# Patient Record
Sex: Female | Born: 1990 | Race: Black or African American | Hispanic: No | Marital: Single | Smoking: Never smoker
Health system: Southern US, Community
[De-identification: ages and names within clinical notes are randomized; demographics above are authoritative.]

## PROBLEM LIST (undated history)

## (undated) DIAGNOSIS — Z789 Other specified health status: Secondary | ICD-10-CM

## (undated) HISTORY — PX: NO PAST SURGERIES: SHX2092

---
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
## 2012-12-02 ENCOUNTER — Emergency Department (INDEPENDENT_AMBULATORY_CARE_PROVIDER_SITE_OTHER)
Admission: EM | Admit: 2012-12-02 | Discharge: 2012-12-02 | Disposition: A | Discharge status: Home / Self Care | Payer: Self-pay | Source: Home / Self Care | Attending: Emergency Medicine | Admitting: Emergency Medicine

## 2012-12-02 ENCOUNTER — Encounter (HOSPITAL_COMMUNITY): Payer: Self-pay | Admitting: Emergency Medicine

## 2012-12-02 DIAGNOSIS — S139XXA Sprain of joints and ligaments of unspecified parts of neck, initial encounter: Secondary | ICD-10-CM

## 2012-12-02 DIAGNOSIS — S161XXA Strain of muscle, fascia and tendon at neck level, initial encounter: Secondary | ICD-10-CM

## 2012-12-02 DIAGNOSIS — R51 Headache: Secondary | ICD-10-CM

## 2012-12-02 MED ORDER — TRAMADOL HCL 50 MG PO TABS: 100.0000 mg | ORAL_TABLET | Freq: Three times a day (TID) | ORAL | Status: DC | PRN

## 2012-12-02 MED ORDER — MELOXICAM 15 MG PO TABS: 15.0000 mg | ORAL_TABLET | Freq: Every day | ORAL | Status: DC

## 2012-12-02 MED ORDER — METHOCARBAMOL 500 MG PO TABS: 500.0000 mg | ORAL_TABLET | Freq: Three times a day (TID) | ORAL | Status: DC
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
## 2016-12-08 NOTE — L&D Delivery Note (Signed)
Patient is 26 y.o. G1P1001 [redacted]w[redacted]d admitted active labor and SROM. S/p augmentation with Pitocin.  Prenatal course also complicated by chlamydia and trich-treated.  Delivery Note At 11:58 AM a viable female was delivered via Vaginal, Spontaneous Delivery (Presentation: ;  ).  APGAR: 4, 5; weight 8 lb 4.3 oz (3750 g).   Placenta status: intact, spontaneous, retained for 45 mins  Cord: 3 vessel  Anesthesia:  epidural Episiotomy: None Lacerations: Labial Suture Repair: n/a Est. Blood Loss (mL): 450  Mom to postpartum.  Baby to NICU.    Upon arrival, patient was complete. She pushed with good maternal effort to deliver a viable female infant in cephalic, LOA position over intact perineum. No nuchal cord present.Anterior shoulder delivered easily. Baby was noted to have poor tone, no respirations, and poor color and place on maternal abdomen for oral suctioning, drying and stimulation. Cord was promptly clamped baby was stimulated, dried, suctioned, and moved to baby warmer. Resuscitation efforts continued and on call pediatrician arrived. Baby was intubated and taken to NICU. Placenta remained retained for 45 minutes. Philipp Deputy, CNM came to room and assisted. Patient given buccal cytotec and placenta delivered spontaneously with gentle cord traction. Fundus firm with massage and Pitocin. Perineum inspected and found to have labial laceration, which was found to be hemostatic.No repair necessary. Counts of sharps, instruments, and lap pads were all correct.   Rolm Bookbinder, DO [place] [facility]

## 2017-03-05 ENCOUNTER — Ambulatory Visit (INDEPENDENT_AMBULATORY_CARE_PROVIDER_SITE_OTHER): Payer: Medicaid Other | Admitting: Obstetrics

## 2017-03-05 ENCOUNTER — Encounter: Payer: Self-pay | Admitting: Obstetrics

## 2017-03-05 VITALS — BP 129/86 | HR 96 | Ht 62.0 in | Wt 144.0 lb

## 2017-03-05 DIAGNOSIS — Z113 Encounter for screening for infections with a predominantly sexual mode of transmission: Secondary | ICD-10-CM

## 2017-03-05 DIAGNOSIS — Z3400 Encounter for supervision of normal first pregnancy, unspecified trimester: Secondary | ICD-10-CM

## 2017-03-05 DIAGNOSIS — Z3480 Encounter for supervision of other normal pregnancy, unspecified trimester: Secondary | ICD-10-CM

## 2017-03-05 DIAGNOSIS — Z3481 Encounter for supervision of other normal pregnancy, first trimester: Secondary | ICD-10-CM | POA: Diagnosis not present

## 2017-03-05 DIAGNOSIS — J301 Allergic rhinitis due to pollen: Secondary | ICD-10-CM

## 2017-03-05 MED ORDER — LORATADINE 10 MG PO TABS: 10.0000 mg | ORAL_TABLET | Freq: Every day | ORAL | 11 refills | Status: DC

## 2017-03-05 MED ORDER — PRENATE PIXIE 10-0.6-0.4-200 MG PO CAPS: 1.0000 | ORAL_CAPSULE | Freq: Every day | ORAL | 11 refills | Status: DC

## 2017-03-05 NOTE — Addendum Note (Signed)
Addended by: Dalphine HandingGARDNER, Robina Hamor L on: 03/05/2017 03:56 PM   Modules accepted: Orders

## 2017-03-05 NOTE — Progress Notes (Signed)
Pt presents for NOB. Pt wants genetic screening.

## 2017-03-05 NOTE — Progress Notes (Signed)
Subjective:    Heidi Rocha is being seen today for her first obstetrical visit.  This is not a planned pregnancy. She is at 7466w0d gestation. Her obstetrical history is significant for none. Relationship with FOB: significant other, not living together. Patient does intend to breast feed. Pregnancy history fully reviewed.  The information documented in the HPI was reviewed and verified.  Menstrual History: OB History    Gravida Para Term Preterm AB Living   1             SAB TAB Ectopic Multiple Live Births                   Patient's last menstrual period was 11/27/2016.    History reviewed. No pertinent past medical history.  History reviewed. No pertinent surgical history.   (Not in a hospital admission) Allergies  Allergen Reactions  . Tuberculin Tests     Social History  Substance Use Topics  . Smoking status: Never Smoker  . Smokeless tobacco: Never Used  . Alcohol use No    History reviewed. No pertinent family history.   Review of Systems Constitutional: negative for weight loss Gastrointestinal: negative for vomiting Genitourinary:negative for genital lesions and vaginal discharge and dysuria Musculoskeletal:negative for back pain Behavioral/Psych: negative for abusive relationship, depression, illegal drug usage and tobacco use    Objective:    BP 129/86   Pulse 96   Ht 5\' 2"  (1.575 m)   Wt 144 lb (65.3 kg)   LMP 11/27/2016   BMI 26.34 kg/m  General Appearance:    Alert, cooperative, no distress, appears stated age  Head:    Normocephalic, without obvious abnormality, atraumatic  Eyes:    PERRL, conjunctiva/corneas clear, EOM's intact, fundi    benign, both eyes  Ears:    Normal TM's and external ear canals, both ears  Nose:   Nares normal, septum midline, mucosa normal, no drainage    or sinus tenderness  Throat:   Lips, mucosa, and tongue normal; teeth and gums normal  Neck:   Supple, symmetrical, trachea midline, no adenopathy;    thyroid:   no enlargement/tenderness/nodules; no carotid   bruit or JVD  Back:     Symmetric, no curvature, ROM normal, no CVA tenderness  Lungs:     Clear to auscultation bilaterally, respirations unlabored  Chest Wall:    No tenderness or deformity   Heart:    Regular rate and rhythm, S1 and S2 normal, no murmur, rub   or gallop  Breast Exam:    No tenderness, masses, or nipple abnormality  Abdomen:     Soft, non-tender, bowel sounds active all four quadrants,    no masses, no organomegaly  Genitalia:    Normal female without lesion, discharge or tenderness  Extremities:   Extremities normal, atraumatic, no cyanosis or edema  Pulses:   2+ and symmetric all extremities  Skin:   Skin color, texture, turgor normal, no rashes or lesions  Lymph nodes:   Cervical, supraclavicular, and axillary nodes normal  Neurologic:   CNII-XII intact, normal strength, sensation and reflexes    throughout      Lab Review Urine pregnancy test Labs reviewed yes Radiologic studies reviewed no Assessment:    Pregnancy at 3066w0d weeks    Plan:     1. Encounter for prenatal care of first pregnancy, antepartum Rx: - Quad Screen next visit - PNV's Rx  Prenatal vitamins.  Counseling provided regarding continued use of seat belts, cessation  of alcohol consumption, smoking or use of illicit drugs; infection precautions i.e., influenza/TDAP immunizations, toxoplasmosis,CMV, parvovirus, listeria and varicella; workplace safety, exercise during pregnancy; routine dental care, safe medications, sexual activity, hot tubs, saunas, pools, travel, caffeine use, fish and methlymercury, potential toxins, hair treatments, varicose veins Weight gain recommendations per IOM guidelines reviewed: underweight/BMI< 18.5--> gain 28 - 40 lbs; normal weight/BMI 18.5 - 24.9--> gain 25 - 35 lbs; overweight/BMI 25 - 29.9--> gain 15 - 25 lbs; obese/BMI >30->gain  11 - 20 lbs Problem list reviewed and updated. FIRST/CF mutation testing/NIPT/QUAD  SCREEN/fragile X/Ashkenazi Jewish population testing/Spinal muscular atrophy discussed: requested. Role of ultrasound in pregnancy discussed; fetal survey: requested. Amniocentesis discussed: not indicated. VBAC calculator score: VBAC consent form provided Meds ordered this encounter  Medications  . Prenat-FeAsp-Meth-FA-DHA w/o A (PRENATE PIXIE) 10-0.6-0.4-200 MG CAPS    Sig: Take 1 capsule by mouth daily before breakfast.    Dispense:  30 capsule    Refill:  11  . loratadine (CLARITIN) 10 MG tablet    Sig: Take 1 tablet (10 mg total) by mouth daily.    Dispense:  30 tablet    Refill:  11   Orders Placed This Encounter  Procedures  . Culture, OB Urine  . Obstetric Panel, Including HIV  . VITAMIN D 25 Hydroxy (Vit-D Deficiency, Fractures)  . Varicella zoster antibody, IgG    Follow up in 2 weeks. 50% of 20 min visit spent on counseling and coordination of care.                                                                                                   Patient ID: STELLAROSE CERNY, female   DOB: [birth year], 26 y.o.   MRN: [id number]

## 2017-03-05 NOTE — Addendum Note (Signed)
Addended by: Dalphine HandingGARDNER, Noreta Kue L on: 03/05/2017 04:48 PM   Modules accepted: Orders

## 2017-03-07 LAB — URINE CULTURE, OB REFLEX

## 2017-03-07 LAB — CULTURE, OB URINE

## 2017-03-09 ENCOUNTER — Other Ambulatory Visit: Payer: Self-pay | Admitting: *Deleted

## 2017-03-09 DIAGNOSIS — Z3480 Encounter for supervision of other normal pregnancy, unspecified trimester: Secondary | ICD-10-CM

## 2017-03-09 LAB — OBSTETRIC PANEL, INCLUDING HIV
Antibody Screen: NEGATIVE
Basophils Absolute: 0 10*3/uL (ref 0.0–0.2)
Basos: 0 %
EOS (ABSOLUTE): 0.2 10*3/uL (ref 0.0–0.4)
Eos: 1 %
HIV Screen 4th Generation wRfx: NONREACTIVE
Hematocrit: 34.5 % (ref 34.0–46.6)
Hemoglobin: 11.4 g/dL (ref 11.1–15.9)
Hepatitis B Surface Ag: NEGATIVE
Immature Grans (Abs): 0.2 10*3/uL — ABNORMAL HIGH (ref 0.0–0.1)
Immature Granulocytes: 1 %
Lymphocytes Absolute: 2.5 10*3/uL (ref 0.7–3.1)
Lymphs: 20 %
MCH: 28.5 pg (ref 26.6–33.0)
MCHC: 33 g/dL (ref 31.5–35.7)
MCV: 86 fL (ref 79–97)
Monocytes Absolute: 0.9 10*3/uL (ref 0.1–0.9)
Monocytes: 7 %
Neutrophils Absolute: 9.1 10*3/uL — ABNORMAL HIGH (ref 1.4–7.0)
Neutrophils: 71 %
Platelets: 313 10*3/uL (ref 150–379)
RBC: 4 x10E6/uL (ref 3.77–5.28)
RDW: 15 % (ref 12.3–15.4)
RPR Ser Ql: NONREACTIVE
Rh Factor: POSITIVE
Rubella Antibodies, IGG: <0.9 index — ABNORMAL LOW (ref >0.99)
WBC: 12.6 10*3/uL — ABNORMAL HIGH (ref 3.4–10.8)

## 2017-03-09 LAB — HEMOGLOBINOPATHY EVALUATION
HGB C: 0 %
HGB S: 0 %
HGB VARIANT: 0 %
Hemoglobin A2 Quantitation: 2.6 % (ref 1.8–3.2)
Hemoglobin F Quantitation: 0 % (ref 0.0–2.0)
Hgb A: 97.4 % (ref 96.4–98.8)

## 2017-03-09 LAB — VITAMIN D 25 HYDROXY (VIT D DEFICIENCY, FRACTURES): Vit D, 25-Hydroxy: 13.2 ng/mL — ABNORMAL LOW (ref 30.0–100.0)

## 2017-03-09 LAB — VARICELLA ZOSTER ANTIBODY, IGG: Varicella zoster IgG: 625 index (ref >165)

## 2017-03-09 MED ORDER — PRENATE PIXIE 10-0.6-0.4-200 MG PO CAPS: 1.0000 | ORAL_CAPSULE | Freq: Every day | ORAL | 11 refills | Status: DC

## 2017-03-09 MED ORDER — VITAFOL GUMMIES 3.33-0.333-34.8 MG PO CHEW: 3.0000 | CHEWABLE_TABLET | Freq: Every day | ORAL | 11 refills | Status: DC

## 2017-03-09 NOTE — Progress Notes (Signed)
Pt called to office stating she could not take PNV that was prescribed. Pt request PNV gummy. Vitafol gummy sent to pharmacy.

## 2017-03-10 ENCOUNTER — Other Ambulatory Visit: Payer: Self-pay | Admitting: Obstetrics

## 2017-03-10 LAB — CERVICOVAGINAL ANCILLARY ONLY
BACTERIAL VAGINITIS: NEGATIVE
Candida vaginitis: POSITIVE — AB
NEISSERIA GONORRHEA: NEGATIVE
TRICH (WINDOWPATH): NEGATIVE

## 2017-03-10 LAB — CYTOLOGY - PAP: Diagnosis: NEGATIVE

## 2017-03-11 ENCOUNTER — Other Ambulatory Visit: Payer: Self-pay | Admitting: Obstetrics

## 2017-03-11 DIAGNOSIS — B373 Candidiasis of vulva and vagina: Secondary | ICD-10-CM

## 2017-03-11 DIAGNOSIS — B3731 Acute candidiasis of vulva and vagina: Secondary | ICD-10-CM

## 2017-03-11 MED ORDER — TERCONAZOLE 0.8 % VA CREA: 1.0000 | TOPICAL_CREAM | Freq: Every day | VAGINAL | 0 refills | Status: DC

## 2017-03-19 ENCOUNTER — Other Ambulatory Visit: Payer: Medicaid Other

## 2017-03-19 ENCOUNTER — Other Ambulatory Visit: Payer: Self-pay

## 2017-03-19 DIAGNOSIS — Z3400 Encounter for supervision of normal first pregnancy, unspecified trimester: Secondary | ICD-10-CM

## 2017-03-24 LAB — AFP, QUAD SCREEN
DIA MOM VALUE: 1.01
DIA VALUE (EIA): 178.1 pg/mL
DSR (By Age)    1 IN: 1071
DSR (Second Trimester) 1 IN: 7105
Gestational Age: 16 WEEKS
MATERNAL AGE AT EDD: 24 a
MSAFP Mom: 1.06
MSAFP: 39.2 ng/mL
MSHCG MOM: 1.08
MSHCG: 43421 m[IU]/mL
OSB RISK: 10000
T18 (By Age): 1:4172 {titer}
TEST RESULTS AFP: NEGATIVE
UE3 VALUE: 0.8 ng/mL
Weight: 144 [lb_av]
uE3 Mom: 1.03

## 2017-04-02 ENCOUNTER — Ambulatory Visit (INDEPENDENT_AMBULATORY_CARE_PROVIDER_SITE_OTHER): Payer: Medicaid Other | Admitting: Obstetrics

## 2017-04-02 ENCOUNTER — Encounter: Payer: Self-pay | Admitting: Obstetrics

## 2017-04-02 VITALS — BP 125/75 | HR 109 | Wt 156.0 lb

## 2017-04-02 DIAGNOSIS — Z3689 Encounter for other specified antenatal screening: Secondary | ICD-10-CM | POA: Diagnosis not present

## 2017-04-02 DIAGNOSIS — Z3402 Encounter for supervision of normal first pregnancy, second trimester: Secondary | ICD-10-CM

## 2017-04-02 DIAGNOSIS — Z3480 Encounter for supervision of other normal pregnancy, unspecified trimester: Secondary | ICD-10-CM

## 2017-04-02 NOTE — Progress Notes (Signed)
Subjective:  Heidi Rocha is a 26 y.o. G1P0 at [redacted]w[redacted]d being seen today for ongoing prenatal care.  She is currently monitored for the following issues for this low-risk pregnancy and has Encounter for prenatal care of first pregnancy, antepartum on her problem list.  Patient reports no complaints.  Contractions: Not present. Vag. Bleeding: None.   . Denies leaking of fluid.   The following portions of the patient's history were reviewed and updated as appropriate: allergies, current medications, past family history, past medical history, past social history, past surgical history and problem list. Problem list updated.  Objective:   Vitals:   04/02/17 1559  BP: 125/75  Pulse: (!) 109  Weight: 156 lb (70.8 kg)    Fetal Status: Fetal Heart Rate (bpm): 150         General:  Alert, oriented and cooperative. Patient is in no acute distress.  Skin: Skin is warm and dry. No rash noted.   Cardiovascular: Normal heart rate noted  Respiratory: Normal respiratory effort, no problems with respiration noted  Abdomen: Soft, gravid, appropriate for gestational age. Pain/Pressure: Absent     Pelvic:  Cervical exam deferred        Extremities: Normal range of motion.     Mental Status: Normal mood and affect. Normal behavior. Normal judgment and thought content.   Urinalysis:      Assessment and Plan:  Pregnancy: G1P0 at [redacted]w[redacted]d  1. Supervision of other normal pregnancy, antepartum Rx: - [place] MFM OB COMP + 14 WK; Future  2. Screening, antenatal, for fetal anatomic survey Rx: - [place] MFM OB COMP + 14 WK; Future  Preterm labor symptoms and general obstetric precautions including but not limited to vaginal bleeding, contractions, leaking of fluid and fetal movement were reviewed in detail with the patient. Please refer to After Visit Summary for other counseling recommendations.  Return in 4 weeks (on 04/30/2017) for ROB.   Brock Bad, MDPatient ID: [name], female   DOB:  [birth year], 26 y.o.   MRN: [id number]

## 2017-04-16 ENCOUNTER — Ambulatory Visit (HOSPITAL_COMMUNITY)
Admission: RE | Admit: 2017-04-16 | Discharge: 2017-04-16 | Disposition: A | Discharge status: Home / Self Care | Payer: Medicaid Other | Source: Ambulatory Visit | Attending: Obstetrics | Admitting: Obstetrics

## 2017-04-16 DIAGNOSIS — Z3A20 20 weeks gestation of pregnancy: Secondary | ICD-10-CM | POA: Diagnosis not present

## 2017-04-16 DIAGNOSIS — Z3482 Encounter for supervision of other normal pregnancy, second trimester: Secondary | ICD-10-CM | POA: Diagnosis not present

## 2017-04-16 DIAGNOSIS — Z3480 Encounter for supervision of other normal pregnancy, unspecified trimester: Secondary | ICD-10-CM

## 2017-04-16 DIAGNOSIS — Z3689 Encounter for other specified antenatal screening: Secondary | ICD-10-CM | POA: Diagnosis present

## 2017-04-30 ENCOUNTER — Ambulatory Visit (INDEPENDENT_AMBULATORY_CARE_PROVIDER_SITE_OTHER): Payer: Medicaid Other | Admitting: Obstetrics

## 2017-04-30 ENCOUNTER — Encounter: Payer: Self-pay | Admitting: Obstetrics

## 2017-04-30 VITALS — BP 128/75 | HR 94 | Wt 166.2 lb

## 2017-04-30 DIAGNOSIS — Z3480 Encounter for supervision of other normal pregnancy, unspecified trimester: Secondary | ICD-10-CM

## 2017-04-30 DIAGNOSIS — Z3482 Encounter for supervision of other normal pregnancy, second trimester: Secondary | ICD-10-CM

## 2017-04-30 DIAGNOSIS — Z3400 Encounter for supervision of normal first pregnancy, unspecified trimester: Secondary | ICD-10-CM

## 2017-04-30 NOTE — Progress Notes (Signed)
Subjective:  Heidi BravoJermey M Rocha is a 26 y.o. G1P0 at 1663w0d being seen today for ongoing prenatal care.  She is currently monitored for the following issues for this low-risk pregnancy and has Encounter for prenatal care of first pregnancy, antepartum on her problem list.  Patient reports no complaints.  Contractions: Not present. Vag. Bleeding: None.  Movement: Present. Denies leaking of fluid.   The following portions of the patient's history were reviewed and updated as appropriate: allergies, current medications, past family history, past medical history, past social history, past surgical history and problem list. Problem list updated.  Objective:   Vitals:   04/30/17 1536  BP: 128/75  Pulse: 94  Weight: 166 lb 3.2 oz (75.4 kg)    Fetal Status: Fetal Heart Rate (bpm): 150   Movement: Present     General:  Alert, oriented and cooperative. Patient is in no acute distress.  Skin: Skin is warm and dry. No rash noted.   Cardiovascular: Normal heart rate noted  Respiratory: Normal respiratory effort, no problems with respiration noted  Abdomen: Soft, gravid, appropriate for gestational age. Pain/Pressure: Absent     Pelvic:  Cervical exam deferred        Extremities: Normal range of motion.  Edema: Trace  Mental Status: Normal mood and affect. Normal behavior. Normal judgment and thought content.   Urinalysis:      Assessment and Plan:  Pregnancy: G1P0 at 5363w0d  1. Encounter for prenatal care of first pregnancy, antepartum   Preterm labor symptoms and general obstetric precautions including but not limited to vaginal bleeding, contractions, leaking of fluid and fetal movement were reviewed in detail with the patient. Please refer to After Visit Summary for other counseling recommendations.  Return in 4 weeks (on 05/28/2017) for ROB.   Brock BadHarper, Charles A, MDPatient ID: Heidi Rocha, female   DOB: 02-05-1991, 26 y.o.   MRN: [id number]

## 2017-04-30 NOTE — Progress Notes (Signed)
Patient reports good fetal movement, denies pain/contractions. 

## 2017-05-28 ENCOUNTER — Encounter: Payer: Self-pay | Admitting: Obstetrics

## 2017-05-28 ENCOUNTER — Ambulatory Visit (INDEPENDENT_AMBULATORY_CARE_PROVIDER_SITE_OTHER): Payer: Medicaid Other | Admitting: Obstetrics

## 2017-05-28 VITALS — BP 110/69 | HR 80 | Wt 171.0 lb

## 2017-05-28 DIAGNOSIS — Z3482 Encounter for supervision of other normal pregnancy, second trimester: Secondary | ICD-10-CM

## 2017-05-28 DIAGNOSIS — Z3480 Encounter for supervision of other normal pregnancy, unspecified trimester: Secondary | ICD-10-CM

## 2017-05-28 NOTE — Progress Notes (Signed)
Subjective:  Heidi Rocha is a [age] y.o. G1P0 at 4834w0d being seen today for ongoing prenatal care.  She is currently monitored for the following issues for this low-risk pregnancy and has Encounter for prenatal care of first pregnancy, antepartum on her problem list.  Patient reports no complaints.  Contractions: Not present. Vag. Bleeding: None.  Movement: Present. Denies leaking of fluid.   The following portions of the patient's history were reviewed and updated as appropriate: allergies, current medications, past family history, past medical history, past social history, past surgical history and problem list. Problem list updated.  Objective:   Vitals:   05/28/17 1509  BP: 110/69  Pulse: 80  Weight: 171 lb (77.6 kg)    Fetal Status: Fetal Heart Rate (bpm): 150   Movement: Present     General:  Alert, oriented and cooperative. Patient is in no acute distress.  Skin: Skin is warm and dry. No rash noted.   Cardiovascular: Normal heart rate noted  Respiratory: Normal respiratory effort, no problems with respiration noted  Abdomen: Soft, gravid, appropriate for gestational age. Pain/Pressure: Absent     Pelvic:  Cervical exam deferred        Extremities: Normal range of motion.  Edema: Trace  Mental Status: Normal mood and affect. Normal behavior. Normal judgment and thought content.   Urinalysis:      Assessment and Plan:  Pregnancy: G1P0 at 6334w0d  1. Supervision of other normal pregnancy, antepartum Doing well.  Preterm labor symptoms and general obstetric precautions including but not limited to vaginal bleeding, contractions, leaking of fluid and fetal movement were reviewed in detail with the patient. Please refer to After Visit Summary for other counseling recommendations.  Return in about 2 weeks (around 06/11/2017) for ROB.   Brock BadHarper, Charles A, MD

## 2017-06-17 ENCOUNTER — Other Ambulatory Visit: Payer: Medicaid Other

## 2017-06-17 ENCOUNTER — Encounter: Payer: Self-pay | Admitting: Obstetrics

## 2017-06-17 ENCOUNTER — Ambulatory Visit (INDEPENDENT_AMBULATORY_CARE_PROVIDER_SITE_OTHER): Payer: Medicaid Other | Admitting: Obstetrics

## 2017-06-17 DIAGNOSIS — Z3403 Encounter for supervision of normal first pregnancy, third trimester: Secondary | ICD-10-CM

## 2017-06-17 DIAGNOSIS — Z23 Encounter for immunization: Secondary | ICD-10-CM

## 2017-06-17 DIAGNOSIS — Z3400 Encounter for supervision of normal first pregnancy, unspecified trimester: Secondary | ICD-10-CM

## 2017-06-17 NOTE — Progress Notes (Signed)
Subjective:  Heidi Rocha is a 26 y.o. G1P0 at 6370w6d being seen today for ongoing prenatal care.  She is currently monitored for the following issues for this low-risk pregnancy and has Encounter for prenatal care of first pregnancy, antepartum on her problem list.  Patient reports no complaints.  Contractions: Not present. Vag. Bleeding: None.  Movement: Present. Denies leaking of fluid.   The following portions of the patient's history were reviewed and updated as appropriate: allergies, current medications, past family history, past medical history, past social history, past surgical history and problem list. Problem list updated.  Objective:   Vitals:   06/17/17 0908  BP: 128/85  Pulse: 98  Weight: 177 lb (80.3 kg)    Fetal Status: Fetal Heart Rate (bpm): 150   Movement: Present     General:  Alert, oriented and cooperative. Patient is in no acute distress.  Skin: Skin is warm and dry. No rash noted.   Cardiovascular: Normal heart rate noted  Respiratory: Normal respiratory effort, no problems with respiration noted  Abdomen: Soft, gravid, appropriate for gestational age. Pain/Pressure: Absent     Pelvic:  Cervical exam deferred        Extremities: Normal range of motion.  Edema: Trace  Mental Status: Normal mood and affect. Normal behavior. Normal judgment and thought content.   Urinalysis:      Assessment and Plan:  Pregnancy: G1P0 at 8170w6d  1. Encounter for prenatal care of first pregnancy, antepartum Rx: - HIV antibody - RPR - Glucose Tolerance, 2 Hours w/1 Hour - CBC  Preterm labor symptoms and general obstetric precautions including but not limited to vaginal bleeding, contractions, leaking of fluid and fetal movement were reviewed in detail with the patient. Please refer to After Visit Summary for other counseling recommendations.  Return in about 2 weeks (around 07/01/2017) for ROB.   Brock BadHarper, Chinedu Agustin A, MDPatient ID: Heidi Rocha, female   DOB:  22-Jun-1991, [age] y.o.   MRN: [id number]

## 2017-06-18 LAB — CBC
HEMATOCRIT: 33.5 % — AB (ref 34.0–46.6)
Hemoglobin: 11 g/dL — ABNORMAL LOW (ref 11.1–15.9)
MCH: 28.6 pg (ref 26.6–33.0)
MCHC: 32.8 g/dL (ref 31.5–35.7)
MCV: 87 fL (ref 79–97)
Platelets: 261 10*3/uL (ref 150–379)
RBC: 3.85 x10E6/uL (ref 3.77–5.28)
RDW: 14.4 % (ref 12.3–15.4)
WBC: 11.6 10*3/uL — ABNORMAL HIGH (ref 3.4–10.8)

## 2017-06-18 LAB — GLUCOSE TOLERANCE, 2 HOURS W/ 1HR
GLUCOSE, 1 HOUR: 133 mg/dL (ref 65–179)
GLUCOSE, 2 HOUR: 91 mg/dL (ref 65–152)
Glucose, Fasting: 71 mg/dL (ref 65–91)

## 2017-06-18 LAB — RPR: RPR: NONREACTIVE

## 2017-06-18 LAB — HIV ANTIBODY (ROUTINE TESTING W REFLEX): HIV SCREEN 4TH GENERATION: NONREACTIVE

## 2017-06-22 ENCOUNTER — Other Ambulatory Visit: Payer: Self-pay | Admitting: Obstetrics

## 2017-06-22 DIAGNOSIS — B3731 Acute candidiasis of vulva and vagina: Secondary | ICD-10-CM

## 2017-06-22 DIAGNOSIS — B373 Candidiasis of vulva and vagina: Secondary | ICD-10-CM

## 2017-06-26 ENCOUNTER — Other Ambulatory Visit: Payer: Self-pay | Admitting: Obstetrics

## 2017-06-26 DIAGNOSIS — B373 Candidiasis of vulva and vagina: Secondary | ICD-10-CM

## 2017-06-26 DIAGNOSIS — B3731 Acute candidiasis of vulva and vagina: Secondary | ICD-10-CM

## 2017-06-26 MED ORDER — TERCONAZOLE 0.8 % VA CREA: 1.0000 | TOPICAL_CREAM | Freq: Every day | VAGINAL | 2 refills | Status: DC

## 2017-07-01 ENCOUNTER — Encounter: Payer: Self-pay | Admitting: Obstetrics

## 2017-07-01 ENCOUNTER — Ambulatory Visit (INDEPENDENT_AMBULATORY_CARE_PROVIDER_SITE_OTHER): Payer: Medicaid Other | Admitting: Obstetrics

## 2017-07-01 VITALS — BP 129/81 | HR 96 | Wt 183.0 lb

## 2017-07-01 DIAGNOSIS — Z3483 Encounter for supervision of other normal pregnancy, third trimester: Secondary | ICD-10-CM

## 2017-07-01 DIAGNOSIS — Z3480 Encounter for supervision of other normal pregnancy, unspecified trimester: Secondary | ICD-10-CM

## 2017-07-01 NOTE — Progress Notes (Signed)
Patient does have sharp pains at time on the L side.

## 2017-07-01 NOTE — Progress Notes (Signed)
Subjective:  Marijean BravoJermey M Weldin is a 26 y.o. G1P0 at 1297w6d being seen today for ongoing prenatal care.  She is currently monitored for the following issues for this low-risk pregnancy and has Encounter for prenatal care of first pregnancy, antepartum on her problem list.  Patient reports backache.  Contractions: Not present. Vag. Bleeding: None.  Movement: Present. Denies leaking of fluid.   The following portions of the patient's history were reviewed and updated as appropriate: allergies, current medications, past family history, past medical history, past social history, past surgical history and problem list. Problem list updated.  Objective:   Vitals:   07/01/17 1428  BP: 129/81  Pulse: 96  Weight: 183 lb (83 kg)    Fetal Status: Fetal Heart Rate (bpm): 150   Movement: Present     General:  Alert, oriented and cooperative. Patient is in no acute distress.  Skin: Skin is warm and dry. No rash noted.   Cardiovascular: Normal heart rate noted  Respiratory: Normal respiratory effort, no problems with respiration noted  Abdomen: Soft, gravid, appropriate for gestational age. Pain/Pressure: Present     Pelvic:  Cervical exam deferred        Extremities: Normal range of motion.  Edema: Trace  Mental Status: Normal mood and affect. Normal behavior. Normal judgment and thought content.   Urinalysis:      Assessment and Plan:  Pregnancy: G1P0 at 3597w6d  1. Supervision of other normal pregnancy, antepartum   Preterm labor symptoms and general obstetric precautions including but not limited to vaginal bleeding, contractions, leaking of fluid and fetal movement were reviewed in detail with the patient. Please refer to After Visit Summary for other counseling recommendations.  Return in about 2 weeks (around 07/15/2017) for ROB.   Brock BadHarper, Reshad Saab A, MD

## 2017-07-15 ENCOUNTER — Ambulatory Visit (INDEPENDENT_AMBULATORY_CARE_PROVIDER_SITE_OTHER): Payer: Medicaid Other | Admitting: Obstetrics

## 2017-07-15 DIAGNOSIS — Z3400 Encounter for supervision of normal first pregnancy, unspecified trimester: Secondary | ICD-10-CM

## 2017-07-15 DIAGNOSIS — Z3403 Encounter for supervision of normal first pregnancy, third trimester: Secondary | ICD-10-CM

## 2017-07-16 ENCOUNTER — Encounter: Payer: Self-pay | Admitting: Obstetrics

## 2017-07-16 NOTE — Progress Notes (Signed)
Subjective:  Heidi Rocha is a 26 y.o. G1P0 at 5043w0d being seen today for ongoing prenatal care.  She is currently monitored for the following issues for this low-risk pregnancy and has Encounter for prenatal care of first pregnancy, antepartum on her problem list.  Patient reports no complaints.  Contractions: Not present. Vag. Bleeding: None.  Movement: Present. Denies leaking of fluid.   The following portions of the patient's history were reviewed and updated as appropriate: allergies, current medications, past family history, past medical history, past social history, past surgical history and problem list. Problem list updated.  Objective:   Vitals:   07/15/17 1338  BP: 126/84  Pulse: (!) 104  Weight: 184 lb (83.5 kg)    Fetal Status: Fetal Heart Rate (bpm): 150   Movement: Present     General:  Alert, oriented and cooperative. Patient is in no acute distress.  Skin: Skin is warm and dry. No rash noted.   Cardiovascular: Normal heart rate noted  Respiratory: Normal respiratory effort, no problems with respiration noted  Abdomen: Soft, gravid, appropriate for gestational age. Pain/Pressure: Absent     Pelvic:  Cervical exam deferred        Extremities: Normal range of motion.  Edema: Trace  Mental Status: Normal mood and affect. Normal behavior. Normal judgment and thought content.   Urinalysis:      Assessment and Plan:  Pregnancy: G1P0 at 7543w0d  1. Encounter for prenatal care of first pregnancy, antepartum   Preterm labor symptoms and general obstetric precautions including but not limited to vaginal bleeding, contractions, leaking of fluid and fetal movement were reviewed in detail with the patient. Please refer to After Visit Summary for other counseling recommendations.  Return in about 2 weeks (around 07/29/2017) for ROB.   Brock BadHarper, Beautifull Cisar A, MD

## 2017-07-30 ENCOUNTER — Ambulatory Visit (INDEPENDENT_AMBULATORY_CARE_PROVIDER_SITE_OTHER): Payer: Medicaid Other | Admitting: Obstetrics

## 2017-07-30 ENCOUNTER — Other Ambulatory Visit (HOSPITAL_COMMUNITY)
Admission: RE | Admit: 2017-07-30 | Discharge: 2017-07-30 | Disposition: A | Discharge status: Home / Self Care | Payer: Medicaid Other | Source: Ambulatory Visit | Attending: Obstetrics | Admitting: Obstetrics

## 2017-07-30 VITALS — BP 118/77 | HR 103 | Wt 184.2 lb

## 2017-07-30 DIAGNOSIS — A5602 Chlamydial vulvovaginitis: Secondary | ICD-10-CM | POA: Insufficient documentation

## 2017-07-30 DIAGNOSIS — O23593 Infection of other part of genital tract in pregnancy, third trimester: Secondary | ICD-10-CM | POA: Diagnosis not present

## 2017-07-30 DIAGNOSIS — A5901 Trichomonal vulvovaginitis: Secondary | ICD-10-CM | POA: Insufficient documentation

## 2017-07-30 DIAGNOSIS — O98313 Other infections with a predominantly sexual mode of transmission complicating pregnancy, third trimester: Secondary | ICD-10-CM | POA: Diagnosis not present

## 2017-07-30 DIAGNOSIS — B9689 Other specified bacterial agents as the cause of diseases classified elsewhere: Secondary | ICD-10-CM | POA: Diagnosis not present

## 2017-07-30 DIAGNOSIS — N760 Acute vaginitis: Secondary | ICD-10-CM | POA: Insufficient documentation

## 2017-07-30 DIAGNOSIS — Z3A35 35 weeks gestation of pregnancy: Secondary | ICD-10-CM | POA: Insufficient documentation

## 2017-07-30 DIAGNOSIS — Z3483 Encounter for supervision of other normal pregnancy, third trimester: Secondary | ICD-10-CM | POA: Diagnosis present

## 2017-07-30 DIAGNOSIS — Z3480 Encounter for supervision of other normal pregnancy, unspecified trimester: Secondary | ICD-10-CM

## 2017-07-30 NOTE — Progress Notes (Signed)
Patient reports no concerns today 

## 2017-07-31 ENCOUNTER — Encounter: Payer: Self-pay | Admitting: Obstetrics

## 2017-07-31 LAB — CERVICOVAGINAL ANCILLARY ONLY
BACTERIAL VAGINITIS: NEGATIVE
CANDIDA VAGINITIS: POSITIVE — AB
CHLAMYDIA, DNA PROBE: POSITIVE — AB
Neisseria Gonorrhea: NEGATIVE
Trichomonas: POSITIVE — AB

## 2017-07-31 NOTE — Progress Notes (Signed)
Subjective:  Heidi Rocha is a 26 y.o. G1P0 at [redacted]w[redacted]d being seen today for ongoing prenatal care.  She is currently monitored for the following issues for this low-risk pregnancy and has Encounter for prenatal care of first pregnancy, antepartum on her problem list.  Patient reports no complaints.  Contractions: Not present. Vag. Bleeding: None.  Movement: Present. Denies leaking of fluid.   The following portions of the patient's history were reviewed and updated as appropriate: allergies, current medications, past family history, past medical history, past social history, past surgical history and problem list. Problem list updated.  Objective:   Vitals:   07/30/17 1053  BP: 118/77  Pulse: (!) 103  Weight: 184 lb 3.2 oz (83.6 kg)    Fetal Status: Fetal Heart Rate (bpm): 150   Movement: Present     General:  Alert, oriented and cooperative. Patient is in no acute distress.  Skin: Skin is warm and dry. No rash noted.   Cardiovascular: Normal heart rate noted  Respiratory: Normal respiratory effort, no problems with respiration noted  Abdomen: Soft, gravid, appropriate for gestational age. Pain/Pressure: Present     Pelvic:  Cervical exam deferred        Extremities: Normal range of motion.  Edema: None  Mental Status: Normal mood and affect. Normal behavior. Normal judgment and thought content.   Urinalysis:      Assessment and Plan:  Pregnancy: G1P0 at [redacted]w[redacted]d  1. Supervision of other normal pregnancy, antepartum Rx: - Strep Gp B NAA - Cervicovaginal ancillary only  Preterm labor symptoms and general obstetric precautions including but not limited to vaginal bleeding, contractions, leaking of fluid and fetal movement were reviewed in detail with the patient. Please refer to After Visit Summary for other counseling recommendations.  Return in about 1 week (around 08/06/2017) for ROB.   Brock Bad, MD

## 2017-08-01 ENCOUNTER — Other Ambulatory Visit: Payer: Self-pay | Admitting: Obstetrics

## 2017-08-01 DIAGNOSIS — B373 Candidiasis of vulva and vagina: Secondary | ICD-10-CM

## 2017-08-01 DIAGNOSIS — B3731 Acute candidiasis of vulva and vagina: Secondary | ICD-10-CM

## 2017-08-01 DIAGNOSIS — A5901 Trichomonal vulvovaginitis: Secondary | ICD-10-CM

## 2017-08-01 DIAGNOSIS — A749 Chlamydial infection, unspecified: Secondary | ICD-10-CM

## 2017-08-01 DIAGNOSIS — O98819 Other maternal infectious and parasitic diseases complicating pregnancy, unspecified trimester: Principal | ICD-10-CM

## 2017-08-01 LAB — STREP GP B NAA: Strep Gp B NAA: NEGATIVE

## 2017-08-01 MED ORDER — METRONIDAZOLE 500 MG PO TABS: 2000.0000 mg | ORAL_TABLET | Freq: Once | ORAL | 0 refills | Status: AC

## 2017-08-01 MED ORDER — FLUCONAZOLE 150 MG PO TABS: 150.0000 mg | ORAL_TABLET | Freq: Once | ORAL | 0 refills | Status: AC

## 2017-08-01 MED ORDER — AZITHROMYCIN 250 MG PO TABS: 1000.0000 mg | ORAL_TABLET | Freq: Once | ORAL | 0 refills | Status: AC

## 2017-08-01 MED ORDER — CEFIXIME 400 MG PO CAPS: 400.0000 mg | ORAL_CAPSULE | Freq: Once | ORAL | 0 refills | Status: AC

## 2017-08-02 LAB — OB RESULTS CONSOLE GBS: STREP GROUP B AG: NEGATIVE

## 2017-08-04 ENCOUNTER — Telehealth: Payer: Self-pay

## 2017-08-04 NOTE — Telephone Encounter (Signed)
Attempted to contact about lab results and rx sent, left vm to call.

## 2017-08-05 ENCOUNTER — Telehealth: Payer: Self-pay

## 2017-08-05 NOTE — Telephone Encounter (Signed)
S/w patient and advised of results, treatment plan and rx sent by provider. Pt stated that she already picked up rx. Faxed HD form

## 2017-08-06 ENCOUNTER — Encounter: Payer: Medicaid Other | Admitting: Obstetrics

## 2017-08-06 ENCOUNTER — Telehealth: Payer: Self-pay

## 2017-08-06 DIAGNOSIS — A549 Gonococcal infection, unspecified: Secondary | ICD-10-CM

## 2017-08-06 MED ORDER — CEFIXIME 200 MG/5ML PO SUSR: 400.0000 mg | Freq: Once | ORAL | 0 refills | Status: AC

## 2017-08-06 NOTE — Telephone Encounter (Signed)
Returned call, and pt stated she could not swallow the suprax medication and would like provider to send it in liquid form...Marland Kitchen.Marland Kitchen.Marland Kitchen. Received verbal provider approval and sent to pharmacy.

## 2017-08-13 ENCOUNTER — Ambulatory Visit (INDEPENDENT_AMBULATORY_CARE_PROVIDER_SITE_OTHER): Payer: Medicaid Other | Admitting: Obstetrics

## 2017-08-13 VITALS — BP 121/81 | HR 102 | Wt 188.4 lb

## 2017-08-13 DIAGNOSIS — Z3480 Encounter for supervision of other normal pregnancy, unspecified trimester: Secondary | ICD-10-CM

## 2017-08-13 DIAGNOSIS — Z3483 Encounter for supervision of other normal pregnancy, third trimester: Secondary | ICD-10-CM

## 2017-08-13 NOTE — Progress Notes (Signed)
Patient reports no concerns today 

## 2017-08-14 ENCOUNTER — Encounter: Payer: Self-pay | Admitting: Obstetrics

## 2017-08-14 NOTE — Progress Notes (Signed)
Subjective:  Heidi Rocha is a 26 y.o. G1P0 at 9375w1d being seen today for ongoing prenatal care.  She is currently monitored for the following issues for this low-risk pregnancy and has Encounter for prenatal care of first pregnancy, antepartum on her problem list.  Patient reports no complaints.  Contractions: Not present. Vag. Bleeding: None.  Movement: Present. Denies leaking of fluid.   The following portions of the patient's history were reviewed and updated as appropriate: allergies, current medications, past family history, past medical history, past social history, past surgical history and problem list. Problem list updated.  Objective:   Vitals:   08/13/17 1539  BP: 121/81  Pulse: (!) 102  Weight: 188 lb 6.4 oz (85.5 kg)    Fetal Status: Fetal Heart Rate (bpm): 150   Movement: Present     General:  Alert, oriented and cooperative. Patient is in no acute distress.  Skin: Skin is warm and dry. No rash noted.   Cardiovascular: Normal heart rate noted  Respiratory: Normal respiratory effort, no problems with respiration noted  Abdomen: Soft, gravid, appropriate for gestational age. Pain/Pressure: Present     Pelvic:  Cervical exam deferred        Extremities: Normal range of motion.  Edema: Trace  Mental Status: Normal mood and affect. Normal behavior. Normal judgment and thought content.   Urinalysis:      Assessment and Plan:  Pregnancy: G1P0 at 5475w1d  1. Supervision of other normal pregnancy, antepartum   Term labor symptoms and general obstetric precautions including but not limited to vaginal bleeding, contractions, leaking of fluid and fetal movement were reviewed in detail with the patient. Please refer to After Visit Summary for other counseling recommendations.  Return in about 1 week (around 08/20/2017) for ROB.   Brock BadHarper, Bernadett Milian A, MD

## 2017-08-20 ENCOUNTER — Encounter: Payer: Self-pay | Admitting: Obstetrics

## 2017-08-20 ENCOUNTER — Ambulatory Visit (INDEPENDENT_AMBULATORY_CARE_PROVIDER_SITE_OTHER): Payer: Medicaid Other | Admitting: Obstetrics

## 2017-08-20 VITALS — BP 139/84 | HR 106 | Wt 192.2 lb

## 2017-08-20 DIAGNOSIS — Z3400 Encounter for supervision of normal first pregnancy, unspecified trimester: Secondary | ICD-10-CM

## 2017-08-20 DIAGNOSIS — Z3480 Encounter for supervision of other normal pregnancy, unspecified trimester: Secondary | ICD-10-CM

## 2017-08-20 DIAGNOSIS — Z202 Contact with and (suspected) exposure to infections with a predominantly sexual mode of transmission: Secondary | ICD-10-CM

## 2017-08-20 DIAGNOSIS — Z3483 Encounter for supervision of other normal pregnancy, third trimester: Secondary | ICD-10-CM

## 2017-08-20 NOTE — Progress Notes (Signed)
Subjective:  Heidi Rocha is a 26 y.o. G1P0 at 4829w0d being seen today for ongoing prenatal care.  She is currently monitored for the following issues for this low-risk pregnancy and has Encounter for prenatal care of first pregnancy, antepartum on her problem list.  Patient reports no complaints.  Contractions: Not present. Vag. Bleeding: None.  Movement: Present. Denies leaking of fluid.   The following portions of the patient's history were reviewed and updated as appropriate: allergies, current medications, past family history, past medical history, past social history, past surgical history and problem list. Problem list updated.  Objective:   Vitals:   08/20/17 1411  BP: 139/84  Pulse: (!) 106  Weight: 192 lb 3.2 oz (87.2 kg)    Fetal Status:     Movement: Present     General:  Alert, oriented and cooperative. Patient is in no acute distress.  Skin: Skin is warm and dry. No rash noted.   Cardiovascular: Normal heart rate noted  Respiratory: Normal respiratory effort, no problems with respiration noted  Abdomen: Soft, gravid, appropriate for gestational age. Pain/Pressure: Present     Pelvic:  Cervical exam deferred        Extremities: Normal range of motion.  Edema: Trace  Mental Status: Normal mood and affect. Normal behavior. Normal judgment and thought content.   Urinalysis:      Assessment and Plan:  Pregnancy: G1P0 at 5729w0d  1. Supervision of other normal pregnancy, antepartum   2. Trichomonas contact, treated   3. Chlamydia contact, treated   Term labor symptoms and general obstetric precautions including but not limited to vaginal bleeding, contractions, leaking of fluid and fetal movement were reviewed in detail with the patient. Please refer to After Visit Summary for other counseling recommendations.  Return in about 1 week (around 08/27/2017) for ROB.   Brock BadHarper, Charles A, MD

## 2017-08-27 ENCOUNTER — Encounter: Payer: Self-pay | Admitting: Obstetrics

## 2017-08-27 ENCOUNTER — Ambulatory Visit (INDEPENDENT_AMBULATORY_CARE_PROVIDER_SITE_OTHER): Payer: Medicaid Other | Admitting: Obstetrics

## 2017-08-27 DIAGNOSIS — Z3403 Encounter for supervision of normal first pregnancy, third trimester: Secondary | ICD-10-CM

## 2017-08-27 DIAGNOSIS — Z3400 Encounter for supervision of normal first pregnancy, unspecified trimester: Secondary | ICD-10-CM

## 2017-08-27 NOTE — Progress Notes (Signed)
Patient reports good fetal movement with little pressure, denies contractions. 

## 2017-08-27 NOTE — Progress Notes (Signed)
Subjective:  Heidi Rocha is a 26 y.o. G1P0 at [redacted]w[redacted]d being seen today for ongoing prenatal care.  She is currently monitored for the following issues for this low-risk pregnancy and has Encounter for prenatal care of first pregnancy, antepartum on her problem list.  Patient reports no complaints.  Contractions: Not present. Vag. Bleeding: None.  Movement: Present. Denies leaking of fluid.   The following portions of the patient's history were reviewed and updated as appropriate: allergies, current medications, past family history, past medical history, past social history, past surgical history and problem list. Problem list updated.  Objective:   Vitals:   08/27/17 1410  BP: 120/82  Pulse: 87  Weight: 193 lb 6.4 oz (87.7 kg)    Fetal Status: Fetal Heart Rate (bpm): 150   Movement: Present     General:  Alert, oriented and cooperative. Patient is in no acute distress.  Skin: Skin is warm and dry. No rash noted.   Cardiovascular: Normal heart rate noted  Respiratory: Normal respiratory effort, no problems with respiration noted  Abdomen: Soft, gravid, appropriate for gestational age. Pain/Pressure: Present     Pelvic:  Cervical exam deferred        Extremities: Normal range of motion.  Edema: Trace  Mental Status: Normal mood and affect. Normal behavior. Normal judgment and thought content.   Urinalysis:      Assessment and Plan:  Pregnancy: G1P0 at [redacted]w[redacted]d  1. Encounter for prenatal care of first pregnancy, antepartum   Preterm labor symptoms and general obstetric precautions including but not limited to vaginal bleeding, contractions, leaking of fluid and fetal movement were reviewed in detail with the patient. Please refer to After Visit Summary for other counseling recommendations.  Return in about 1 week (around 09/03/2017) for ROB.   Brock Bad, MD

## 2017-09-03 ENCOUNTER — Encounter: Payer: Medicaid Other | Admitting: Certified Nurse Midwife

## 2017-09-03 ENCOUNTER — Inpatient Hospital Stay (HOSPITAL_COMMUNITY): Payer: Medicaid Other | Admitting: Anesthesiology

## 2017-09-03 ENCOUNTER — Inpatient Hospital Stay (HOSPITAL_COMMUNITY)
Admission: AD | Admit: 2017-09-03 | Discharge: 2017-09-06 | DRG: 774 | Disposition: A | Discharge status: Home / Self Care | Payer: Medicaid Other | Source: Ambulatory Visit | Attending: Obstetrics & Gynecology | Admitting: Obstetrics & Gynecology

## 2017-09-03 ENCOUNTER — Encounter (HOSPITAL_COMMUNITY): Payer: Self-pay

## 2017-09-03 DIAGNOSIS — O9902 Anemia complicating childbirth: Secondary | ICD-10-CM | POA: Diagnosis present

## 2017-09-03 DIAGNOSIS — D649 Anemia, unspecified: Secondary | ICD-10-CM | POA: Diagnosis present

## 2017-09-03 DIAGNOSIS — Z3A40 40 weeks gestation of pregnancy: Secondary | ICD-10-CM | POA: Diagnosis not present

## 2017-09-03 DIAGNOSIS — O26893 Other specified pregnancy related conditions, third trimester: Secondary | ICD-10-CM | POA: Diagnosis present

## 2017-09-03 DIAGNOSIS — O864 Pyrexia of unknown origin following delivery: Secondary | ICD-10-CM | POA: Diagnosis not present

## 2017-09-03 HISTORY — DX: Other specified health status: Z78.9

## 2017-09-03 LAB — CBC
HCT: 34.8 % — ABNORMAL LOW (ref 36.0–46.0)
Hemoglobin: 11.3 g/dL — ABNORMAL LOW (ref 12.0–15.0)
MCH: 26.5 pg (ref 26.0–34.0)
MCHC: 32.5 g/dL (ref 30.0–36.0)
MCV: 81.5 fL (ref 78.0–100.0)
Platelets: 242 10*3/uL (ref 150–400)
RBC: 4.27 MIL/uL (ref 3.87–5.11)
RDW: 16.9 % — AB (ref 11.5–15.5)
WBC: 18.4 10*3/uL — ABNORMAL HIGH (ref 4.0–10.5)

## 2017-09-03 LAB — TYPE AND SCREEN
ABO/RH(D): O POS
ANTIBODY SCREEN: NEGATIVE

## 2017-09-03 LAB — WET PREP, GENITAL
Clue Cells Wet Prep HPF POC: NONE SEEN
Sperm: NONE SEEN
Trich, Wet Prep: NONE SEEN

## 2017-09-03 LAB — ABO/RH: ABO/RH(D): O POS

## 2017-09-03 LAB — POCT FERN TEST: POCT Fern Test: NEGATIVE

## 2017-09-03 MED ORDER — LIDOCAINE HCL (PF) 1 % IJ SOLN
30.0000 mL | INTRAMUSCULAR | Status: DC | PRN
  Filled 2017-09-03: qty 30

## 2017-09-03 MED ORDER — ACETAMINOPHEN 325 MG PO TABS: 650.0000 mg | ORAL_TABLET | ORAL | Status: DC | PRN

## 2017-09-03 MED ORDER — DIPHENHYDRAMINE HCL 50 MG/ML IJ SOLN: 12.5000 mg | INTRAMUSCULAR | Status: DC | PRN

## 2017-09-03 MED ORDER — LACTATED RINGERS IV SOLN: 500.0000 mL | INTRAVENOUS | Status: DC | PRN

## 2017-09-03 MED ORDER — PHENYLEPHRINE 40 MCG/ML (10ML) SYRINGE FOR IV PUSH (FOR BLOOD PRESSURE SUPPORT)
80.0000 ug | PREFILLED_SYRINGE | INTRAVENOUS | Status: DC | PRN
  Filled 2017-09-03: qty 5
  Filled 2017-09-03: qty 10

## 2017-09-03 MED ORDER — FLEET ENEMA 7-19 GM/118ML RE ENEM: 1.0000 | ENEMA | RECTAL | Status: DC | PRN

## 2017-09-03 MED ORDER — OXYCODONE-ACETAMINOPHEN 5-325 MG PO TABS: 1.0000 | ORAL_TABLET | ORAL | Status: DC | PRN

## 2017-09-03 MED ORDER — EPHEDRINE 5 MG/ML INJ
10.0000 mg | INTRAVENOUS | Status: DC | PRN
  Filled 2017-09-03: qty 2

## 2017-09-03 MED ORDER — OXYTOCIN BOLUS FROM INFUSION
500.0000 mL | Freq: Once | INTRAVENOUS | Status: AC
  Administered 2017-09-04: 500 mL via INTRAVENOUS

## 2017-09-03 MED ORDER — OXYTOCIN 40 UNITS IN LACTATED RINGERS INFUSION - SIMPLE MED
1.0000 m[IU]/min | INTRAVENOUS | Status: DC
  Administered 2017-09-03: 2 m[IU]/min via INTRAVENOUS

## 2017-09-03 MED ORDER — OXYTOCIN 40 UNITS IN LACTATED RINGERS INFUSION - SIMPLE MED
2.5000 [IU]/h | INTRAVENOUS | Status: DC
  Filled 2017-09-03: qty 1000

## 2017-09-03 MED ORDER — LACTATED RINGERS IV SOLN
500.0000 mL | Freq: Once | INTRAVENOUS | Status: AC
  Administered 2017-09-03: 500 mL via INTRAVENOUS

## 2017-09-03 MED ORDER — OXYCODONE-ACETAMINOPHEN 5-325 MG PO TABS: 2.0000 | ORAL_TABLET | ORAL | Status: DC | PRN

## 2017-09-03 MED ORDER — FENTANYL 2.5 MCG/ML BUPIVACAINE 1/10 % EPIDURAL INFUSION (WH - ANES)
14.0000 mL/h | INTRAMUSCULAR | Status: DC | PRN
Start: 2017-09-03 — End: 2017-09-04
  Administered 2017-09-04 (×2): 14 mL/h via EPIDURAL
  Filled 2017-09-03 (×3): qty 100

## 2017-09-03 MED ORDER — TERBUTALINE SULFATE 1 MG/ML IJ SOLN
0.2500 mg | Freq: Once | INTRAMUSCULAR | Status: DC | PRN
  Filled 2017-09-03: qty 1

## 2017-09-03 MED ORDER — SOD CITRATE-CITRIC ACID 500-334 MG/5ML PO SOLN
30.0000 mL | ORAL | Status: DC | PRN
  Filled 2017-09-03: qty 15

## 2017-09-03 MED ORDER — PHENYLEPHRINE 40 MCG/ML (10ML) SYRINGE FOR IV PUSH (FOR BLOOD PRESSURE SUPPORT)
80.0000 ug | PREFILLED_SYRINGE | INTRAVENOUS | Status: DC | PRN
  Filled 2017-09-03: qty 5

## 2017-09-03 MED ORDER — LACTATED RINGERS IV SOLN
INTRAVENOUS | Status: DC
  Administered 2017-09-03 – 2017-09-04 (×4): via INTRAVENOUS

## 2017-09-03 MED ORDER — ONDANSETRON HCL 4 MG/2ML IJ SOLN
4.0000 mg | Freq: Four times a day (QID) | INTRAMUSCULAR | Status: DC | PRN
  Administered 2017-09-03: 4 mg via INTRAVENOUS
  Filled 2017-09-03: qty 2

## 2017-09-03 NOTE — MAU Provider Note (Signed)
. Chief Complaint:  Rupture of Membranes and Contractions   First Provider Initiated Contact with Patient 09/03/17 [phone]      HPI: Heidi Rocha is a 26 y.o. G1P0 at 80w0dwho presents to maternity admissions reporting Leaking of fluid since 0727.  States it was a large gush She reports good fetal movement, denies vaginal bleeding, vaginal itching/burning, urinary symptoms, h/a, dizziness, n/v, or fever/chills.    Vaginal Discharge  The patient's primary symptoms include pelvic pain and vaginal discharge. The patient's pertinent negatives include no genital itching, genital lesions, genital odor or vaginal bleeding. This is a new problem. The current episode started today. The problem occurs intermittently. The pain is moderate. She is pregnant. Associated symptoms include abdominal pain. Pertinent negatives include no chills, constipation, diarrhea, fever, frequency, nausea or vomiting. The vaginal discharge was clear and watery. There has been no bleeding. She has not been passing clots. She has not been passing tissue. Nothing aggravates the symptoms. She has tried nothing for the symptoms.    RN note: Pt reports ROM at 0727, contractions  Past Medical History: No past medical history on file.  Past obstetric history: OB History  Gravida Para Term Preterm AB Living  1            SAB TAB Ectopic Multiple Live Births               # Outcome Date GA Lbr Len/2nd Weight Sex Delivery Anes PTL Lv  1 Current               Past Surgical History: No past surgical history on file.  Family History: No family history on file.  Social History: Social History  Substance Use Topics  . Smoking status: Never Smoker  . Smokeless tobacco: Never Used  . Alcohol use No    Allergies:  Allergies  Allergen Reactions  . Tuberculin Tests     Meds:  Prescriptions Prior to Admission  Medication Sig Dispense Refill Last Dose  . loratadine (CLARITIN) 10 MG tablet Take 1 tablet (10 mg  total) by mouth daily. 30 tablet 11 Past Week at Unknown time  . Prenatal Vit-Fe Phos-FA-Omega (VITAFOL GUMMIES) 3.33-0.333-34.8 MG CHEW Chew 3 each by mouth daily. 90 tablet 11 09/03/2017 at Unknown time    ROS:  Review of Systems  Constitutional: Negative for chills and fever.  Gastrointestinal: Positive for abdominal pain. Negative for constipation, diarrhea, nausea and vomiting.  Genitourinary: Positive for pelvic pain and vaginal discharge. Negative for frequency.     I have reviewed patient's Past Medical Hx, Surgical Hx, Family Hx, Social Hx, medications and allergies.   Physical Exam  Patient Vitals for the past 24 hrs:  BP Temp Temp src Pulse Resp SpO2 Height Weight  09/03/17 0830 134/83 98.5 F (36.9 C) Oral (!) 118 16 95 %  (1.575 m) 192 lb (87.1 kg)   Constitutional: Well-developed, well-nourished female in no acute distress.  Cardiovascular: normal rate and rhythm Respiratory: normal effort, no distress GI: Abd soft, non-tender, gravid appropriate for gestational age.  MS: Extremities nontender, no edema, normal ROM Neurologic: Alert and oriented x 4.  GU: Neg CVAT.  PELVIC EXAM: Cervix pink, visually closed, without lesion, coious thick curdlik white discharge, vaginal walls and external genitalia normal  Dilation: 2 Effacement (%): 90 Station: -3 Presentation: Vertex Exam by:: Artelia [name], CNM  FHT:  Baseline 140 , moderate variability, accelerations present, no decelerations Contractions: Irregular   Labs: O/Positive/-- (03/29 1610) Results for orders  placed or performed during the hospital encounter of 09/03/17 (from the past 24 hour(s))  POCT fern test     Status: None   Collection Time: 09/03/17  9:27 AM  Result Value Ref Range   POCT Fern Test Negative = intact amniotic membranes   CBC     Status: Abnormal   Collection Time: 09/03/17 11:10 AM  Result Value Ref Range   WBC 18.4 (H) 4.0 - 10.5 K/uL   RBC 4.27 3.87 - 5.11 MIL/uL   Hemoglobin  11.3 (L) 12.0 - 15.0 g/dL   HCT 40.9 (L) 81.1 - 91.4 %   MCV 81.5 78.0 - 100.0 fL   MCH 26.5 26.0 - 34.0 pg   MCHC 32.5 30.0 - 36.0 g/dL   RDW 78.2 (H) 95.6 - 21.3 %   Platelets 242 150 - 400 K/uL  Type and screen [facility]     Status: None   Collection Time: 09/03/17 11:10 AM  Result Value Ref Range   ABO/RH(D) O POS    Antibody Screen NEG    Sample Expiration 09/06/2017   Wet prep, genital     Status: Abnormal   Collection Time: 09/03/17 12:39 PM  Result Value Ref Range   Yeast Wet Prep HPF POC PRESENT (A) NONE SEEN   Trich, Wet Prep NONE SEEN NONE SEEN   Clue Cells Wet Prep HPF POC NONE SEEN NONE SEEN   WBC, Wet Prep HPF POC MODERATE (A) NONE SEEN   Sperm NONE SEEN     Imaging:  No results found.  MAU Course/MDM: I have Done a speculum exam and fern test which was Negative.  Pooling was negative Nitrazine not done.   She will be observed for labor now  Assessment: SIUP at [redacted]w[redacted]d Vaginal discharge  Plan: Observe for labor per RN  Wynelle Bourgeois CNM, MSN Certified Nurse-Midwife 09/03/2017 9:28 AM

## 2017-09-03 NOTE — Anesthesia Preprocedure Evaluation (Signed)
Anesthesia Evaluation  Patient identified by MRN, date of birth, ID band Patient awake    Reviewed: Allergy & Precautions, H&P , NPO status , Patient's Chart, lab work & pertinent test results  History of Anesthesia Complications Negative for: history of anesthetic complications  Airway Mallampati: II  TM Distance: >3 FB Neck ROM: full    Dental no notable dental hx. (+) Teeth Intact   Pulmonary neg pulmonary ROS,    Pulmonary exam normal breath sounds clear to auscultation       Cardiovascular negative cardio ROS Normal cardiovascular exam Rhythm:regular Rate:Normal     Neuro/Psych negative neurological ROS  negative psych ROS   GI/Hepatic negative GI ROS, Neg liver ROS,   Endo/Other  negative endocrine ROS  Renal/GU negative Renal ROS     Musculoskeletal   Abdominal (+) + obese,   Peds  Hematology  (+) anemia ,   Anesthesia Other Findings   Reproductive/Obstetrics (+) Pregnancy                             Anesthesia Physical Anesthesia Plan  ASA: II  Anesthesia Plan: Epidural   Post-op Pain Management:    Induction:   PONV Risk Score and Plan:   Airway Management Planned:   Additional Equipment:   Intra-op Plan:   Post-operative Plan:   Informed Consent: I have reviewed the patients History and Physical, chart, labs and discussed the procedure including the risks, benefits and alternatives for the proposed anesthesia with the patient or authorized representative who has indicated his/her understanding and acceptance.     Plan Discussed with:   Anesthesia Plan Comments:         Anesthesia Quick Evaluation

## 2017-09-03 NOTE — Anesthesia Procedure Notes (Signed)
Epidural Patient location during procedure: OB Start time: 09/03/2017 5:35 PM  Staffing Anesthesiologist: Karna Christmas P Performed: anesthesiologist   Preanesthetic Checklist Completed: patient identified, site marked, pre-op evaluation, timeout performed, IV checked, risks and benefits discussed and monitors and equipment checked  Epidural Patient position: sitting Prep: DuraPrep Patient monitoring: heart rate, cardiac monitor, continuous pulse ox and blood pressure Approach: midline Location: L4-L5 Injection technique: LOR air  Needle:  Needle type: Tuohy  Needle gauge: 17 G Needle length: 9 cm Needle insertion depth: 7 cm Catheter type: closed end flexible Catheter size: 19 Gauge Catheter at skin depth: 12 cm Test dose: negative and Other  Assessment Events: blood not aspirated, injection not painful, no injection resistance and negative IV test  Additional Notes Informed consent obtained prior to proceeding including risk of failure, 1% risk of PDPH, risk of minor discomfort and bruising.  Discussed rare but serious complications. Discussed alternatives to epidural analgesia and patient desires to proceed.  Timeout performed pre-procedure verifying patient name, procedure, and platelet count.  Patient tolerated procedure well. Reason for block:procedure for pain

## 2017-09-03 NOTE — Progress Notes (Signed)
Labor Progress Note  Heidi AYDELOTTE is a 26 y.o. G1P0 at [redacted]w[redacted]d  admitted for active labor  S: Pt is comfortable and without pain following administration of epidural within the last hour. She reports barely feeling contractions. She denies headache, scotomata, or RUQ pain.    O:  BP 128/79   Pulse (!) 102   Temp 97.7 F (36.5 C) (Oral)   Resp 18   Ht  (1.575 m)   Wt 87.1 kg (192 lb)   LMP 11/27/2016   SpO2 99%   BMI 35.12 kg/m   No intake/output data recorded.  FHT:  FHR: 140 bpm, variability: moderate,  accelerations:  Present,  decelerations:  Absent UC:   Regular contractions SVE:   Dilation: 6.5 Effacement (%): 80 Station: -1 Exam by:: Dr. Rachelle Hora  SROM/AROM: SROM prior to visit; clear  Pitocin 2X2 @ 6 mu/min  Labs: Lab Results  Component Value Date   WBC 18.4 (H) 09/03/2017   HGB 11.3 (L) 09/03/2017   HCT 34.8 (L) 09/03/2017   MCV 81.5 09/03/2017   PLT 242 09/03/2017    Assessment / Plan: 26 y.o. G1P0 [redacted]w[redacted]d SOL in early/active labor Spontaneous labor, progressing normally  Labor: Progressing normally, augmented by Pitocin Fetal Wellbeing:  Category I Pain Control:  Epidural Anticipated MOD:  NSVD  Expectant management  Ernestene Kiel MS3

## 2017-09-03 NOTE — MAU Note (Signed)
Pt reports ROM at 0727, contractions

## 2017-09-03 NOTE — Progress Notes (Signed)
Patient Vitals for the past 4 hrs:  BP Temp Temp src Pulse Resp SpO2  09/03/17 2143 - 98.9 F (37.2 C) Oral - 16 -  09/03/17 2131 127/76 - - (!) 116 - -  09/03/17 2120 - 99.5 F (37.5 C) Oral - 17 -  09/03/17 2101 (!) 115/57 - - (!) 116 - -  09/03/17 2031 124/67 - - (!) 112 - -  09/03/17 2030 124/67 - - (!) 112 - -  09/03/17 1959 122/65 98.8 F (37.1 C) Oral (!) 116 17 -  09/03/17 1950 - - - - - 99 %  09/03/17 1940 - - - - - 100 %  09/03/17 1935 - - - - - 99 %  09/03/17 1932 127/84 - - (!) 105 - -  09/03/17 1930 - - - - - 100 %  09/03/17 1900 130/83 - - (!) 105 18 -   Pt sleeping. FHR 140's, Cat 1.  IUPC placed.  MVUs 180-200. Pitocin at 17mu/min.   Cx 7.5/90/-1.    Pt's skin feels warm, will monitor temp.  Continue present management

## 2017-09-03 NOTE — H&P (Signed)
LABOR ADMISSION HISTORY AND PHYSICAL  Heidi Rocha is a 26 y.o. female G1P0 with IUP at [redacted]w[redacted]d by LMP presenting for SOL with possible SROM. Reports ctx since last night, and gush of fluid this morning around 0730. She reports +FMs, No LOF, no VB, no blurry vision, no headaches, no peripheral edema, and no RUQ pain.  Dating: By LMP --->  Estimated Date of Delivery: 09/03/17  Prenatal History/Complications: Patient initiated prenatal care at 14 weeks at [place] Positive chlamydia and trichomonas on 8/23, TOC unknown for both  Past Medical History: No past medical history on file.  Past Surgical History: No past surgical history on file.  Obstetrical History: OB History    Gravida Para Term Preterm AB Living   1             SAB TAB Ectopic Multiple Live Births                  Social History: Social History   Social History  . Marital status: Single    Spouse name: N/A  . Number of children: N/A  . Years of education: N/A   Social History Main Topics  . Smoking status: Never Smoker  . Smokeless tobacco: Never Used  . Alcohol use No  . Drug use: No  . Sexual activity: Yes    Partners: Male    Birth control/ protection: None   Other Topics Concern  . Not on file   Social History Narrative  . No narrative on file    Family History: No family history on file.  Allergies: Allergies  Allergen Reactions  . Tuberculin Tests     Prescriptions Prior to Admission  Medication Sig Dispense Refill Last Dose  . loratadine (CLARITIN) 10 MG tablet Take 1 tablet (10 mg total) by mouth daily. 30 tablet 11 Past Week at Unknown time  . Prenatal Vit-Fe Phos-FA-Omega (VITAFOL GUMMIES) 3.33-0.333-34.8 MG CHEW Chew 3 each by mouth daily. 90 tablet 11 09/03/2017 at Unknown time     Review of Systems   All systems reviewed and negative except as stated in HPI  Blood pressure 134/83, pulse (!) 118, temperature 98.5 F (36.9 C), temperature source Oral, resp. rate 16, height 5'  2" (1.575 m), weight 87.1 kg (192 lb), last menstrual period 11/27/2016, SpO2 95 %. General appearance: alert and cooperative Lungs: normal work of breathing Extremities: Homans sign is negative, no sign of DVT Presentation: cephalic Fetal monitoringBaseline: 140 bpm, Variability: Good {> 6 bpm), Accelerations: Reactive and Decelerations: Absent Uterine activity infrequent Dilation: 3.5 Effacement (%): 90 Station: -3 Exam by:: Janeth Rase RNC   Prenatal labs: ABO, Rh: O/Positive/-- (03/29 1610) Antibody: Negative (03/29 1610) Rubella: Nonimmune RPR: Non Reactive (07/11 0900)  HBsAg: Negative (03/29 1610)  HIV:  Nonreactive GBS: Negative (08/26 0000)  GTT: Fasting- 71, 1 hr- 133, 2 hr- 91  Prenatal Transfer Tool  Maternal Diabetes: No Genetic Screening: Normal Maternal Ultrasounds/Referrals: Normal Fetal Ultrasounds or other Referrals:  None Maternal Substance Abuse:  No Significant Maternal Medications:  None Significant Maternal Lab Results: Lab values include: Group B Strep negative, Rubella non-immune, chlamydia and trichomonas positive  Results for orders placed or performed during the hospital encounter of 09/03/17 (from the past 24 hour(s))  POCT fern test   Collection Time: 09/03/17  9:27 AM  Result Value Ref Range   POCT Fern Test Negative = intact amniotic membranes     Patient Active Problem List   Diagnosis Date Noted  . Encounter  for prenatal care of first pregnancy, antepartum 03/05/2017    Assessment: Heidi Rocha is a 26 y.o. G1P0 at [redacted]w[redacted]d here for SOL with possible SROM  #Labor:Expectant management for now #Pain: Planning on epidural #FWB: Cat I #ID: GBS neg. Will get TOC for Chlamy and trich #MOF: breast and bottle  #MOC: unknown  #Circ: yes, outpatient   Kandra Nicolas. Sherra Kimmons, MD OB Fellow 09/03/2017, 12:36 PM

## 2017-09-04 ENCOUNTER — Encounter (HOSPITAL_COMMUNITY): Payer: Self-pay | Admitting: *Deleted

## 2017-09-04 DIAGNOSIS — Z3A40 40 weeks gestation of pregnancy: Secondary | ICD-10-CM

## 2017-09-04 LAB — RPR: RPR: NONREACTIVE

## 2017-09-04 LAB — GC/CHLAMYDIA PROBE AMP (~~LOC~~) NOT AT ARMC
Chlamydia: NEGATIVE
NEISSERIA GONORRHEA: NEGATIVE

## 2017-09-04 MED ORDER — IBUPROFEN 600 MG PO TABS
600.0000 mg | ORAL_TABLET | Freq: Four times a day (QID) | ORAL | Status: DC
  Filled 2017-09-04: qty 1

## 2017-09-04 MED ORDER — DIBUCAINE 1 % RE OINT: 1.0000 "application " | TOPICAL_OINTMENT | RECTAL | Status: DC | PRN

## 2017-09-04 MED ORDER — PIPERACILLIN-TAZOBACTAM 3.375 G IVPB
3.3750 g | Freq: Three times a day (TID) | INTRAVENOUS | Status: DC
  Filled 2017-09-04 (×2): qty 50

## 2017-09-04 MED ORDER — ONDANSETRON HCL 4 MG PO TABS: 4.0000 mg | ORAL_TABLET | ORAL | Status: DC | PRN

## 2017-09-04 MED ORDER — IBUPROFEN 100 MG/5ML PO SUSP
600.0000 mg | Freq: Four times a day (QID) | ORAL | Status: DC
  Administered 2017-09-04 – 2017-09-06 (×8): 600 mg via ORAL
  Filled 2017-09-04 (×12): qty 30

## 2017-09-04 MED ORDER — WITCH HAZEL-GLYCERIN EX PADS: 1.0000 "application " | MEDICATED_PAD | CUTANEOUS | Status: DC | PRN

## 2017-09-04 MED ORDER — MISOPROSTOL 200 MCG PO TABS
600.0000 ug | ORAL_TABLET | Freq: Once | ORAL | Status: AC
  Administered 2017-09-04: 600 ug via ORAL

## 2017-09-04 MED ORDER — SIMETHICONE 80 MG PO CHEW: 80.0000 mg | CHEWABLE_TABLET | ORAL | Status: DC | PRN

## 2017-09-04 MED ORDER — ONDANSETRON HCL 4 MG/2ML IJ SOLN: 4.0000 mg | INTRAMUSCULAR | Status: DC | PRN

## 2017-09-04 MED ORDER — MEASLES, MUMPS & RUBELLA VAC ~~LOC~~ INJ
0.5000 mL | INJECTION | Freq: Once | SUBCUTANEOUS | Status: AC
  Administered 2017-09-06: 0.5 mL via SUBCUTANEOUS
  Filled 2017-09-04 (×2): qty 0.5

## 2017-09-04 MED ORDER — BENZOCAINE-MENTHOL 20-0.5 % EX AERO: 1.0000 "application " | INHALATION_SPRAY | CUTANEOUS | Status: DC | PRN

## 2017-09-04 MED ORDER — COCONUT OIL OIL: 1.0000 "application " | TOPICAL_OIL | Status: DC | PRN

## 2017-09-04 MED ORDER — LACTATED RINGERS IV SOLN
INTRAVENOUS | Status: DC
  Administered 2017-09-04: 16:00:00 via INTRAVENOUS

## 2017-09-04 MED ORDER — ACETAMINOPHEN 325 MG PO TABS
650.0000 mg | ORAL_TABLET | ORAL | Status: DC | PRN
  Filled 2017-09-04: qty 2

## 2017-09-04 MED ORDER — PRENATAL MULTIVITAMIN CH: 1.0000 | ORAL_TABLET | Freq: Every day | ORAL | Status: DC

## 2017-09-04 MED ORDER — SODIUM CHLORIDE 0.9 % IV SOLN
2.0000 g | Freq: Four times a day (QID) | INTRAVENOUS | Status: DC
  Filled 2017-09-04 (×2): qty 2000

## 2017-09-04 MED ORDER — DIPHENHYDRAMINE HCL 25 MG PO CAPS: 25.0000 mg | ORAL_CAPSULE | Freq: Four times a day (QID) | ORAL | Status: DC | PRN

## 2017-09-04 MED ORDER — ACETAMINOPHEN 160 MG/5ML PO SOLN
650.0000 mg | ORAL | Status: DC | PRN
  Administered 2017-09-04: 650 mg via ORAL
  Filled 2017-09-04 (×2): qty 20.3

## 2017-09-04 MED ORDER — SENNOSIDES-DOCUSATE SODIUM 8.6-50 MG PO TABS
2.0000 | ORAL_TABLET | ORAL | Status: DC
  Administered 2017-09-04 – 2017-09-05 (×2): 2 via ORAL
  Filled 2017-09-04 (×2): qty 2

## 2017-09-04 MED ORDER — ZOLPIDEM TARTRATE 5 MG PO TABS: 5.0000 mg | ORAL_TABLET | Freq: Every evening | ORAL | Status: DC | PRN

## 2017-09-04 MED ORDER — TETANUS-DIPHTH-ACELL PERTUSSIS 5-2.5-18.5 LF-MCG/0.5 IM SUSP: 0.5000 mL | Freq: Once | INTRAMUSCULAR | Status: DC

## 2017-09-04 NOTE — Progress Notes (Signed)
I offered support to family after the Code Apgar, in particular Jermey's parents and grandfather as well as FOB.  I later offered support to [place].  At that point, she and her family were doing much better having received a better report from the NICU.    Chaplain Dyanne Carrel, Bcc Pager, [phone] 1:41 PM    09/04/17 1300  Clinical Encounter Type  Visited With Patient;Family  Referral From Other (Comment)  Consult/Referral To (Code Apgar)  Spiritual Encounters  Spiritual Needs Prayer;Emotional

## 2017-09-04 NOTE — Lactation Note (Signed)
This note was copied from a baby's chart. Lactation Consultation Note  Patient Name: Boy Nautia Lem BOQUC'L Date: 09/04/2017 Reason for consult: Initial assessment;Primapara;NICU baby  Mom assisted w/pumping on the preemie setting. Size 24 flanges are appropriate for her at this time. No colostrum yielded.  NICU booklet (pumping frequency & milk storage guidelines emphasized). Colostrum stickers were provided. Mom was shown how to assemble & use hand pump that was included in pump kit. Hand expression was taught to Mom.   Mom denied breast changes w/pregnancy, but her anatomy suggests that she will make an adequate amount of milk.  Matthias Hughs [facility] 09/04/2017, 10:26 PM

## 2017-09-04 NOTE — Progress Notes (Signed)
Heidi Rocha is a 26 y.o. G1P0 at [redacted]w[redacted]d by LMP admitted for active labor and possibel SROM  Subjective:  Comfortable with epidural.  Denies any vaginal pressure.     Objective: BP 123/88   Pulse (!) 115   Temp 98.7 F (37.1 C) (Oral)   Resp 16   Ht  (1.575 m)   Wt 87.1 kg (192 lb)   LMP 11/27/2016   SpO2 99%   BMI 35.12 kg/m  I/O last 3 completed shifts: In: -  Out: 300 [Urine:300] No intake/output data recorded.  FHT:  FHR: 150 bpm, variability: moderate,  accelerations:  Present,  decelerations:  Present variable noted with vaginal exam UC:   regular, every 1.5-2 minutes SVE:   Dilation: 10 Effacement (%): 100 Station: +1 Exam by:: Heidi Rocha, midwife student  Labs: Lab Results  Component Value Date   WBC 18.4 (H) 09/03/2017   HGB 11.3 (L) 09/03/2017   HCT 34.8 (L) 09/03/2017   MCV 81.5 09/03/2017   PLT 242 09/03/2017    Assessment / Plan: Spontaneous labor, progressing normally   Labor: Progressing normally Fetal Wellbeing:  Category I Pain Control:  Epidural  I/D: + chl and trich 8/23 Anticipated MOD:  NSVD  Heidi Rocha 09/04/2017, 7:29 AM

## 2017-09-04 NOTE — Consult Note (Deleted)
Delivery Note:  Called by Code Apgar to mom's room to attend to baby just born vaginally with cardio resp depression. NICU Team arrived after 1 min of age. At this time, infant was in the RW receiving PPV, HR audible on pulse ox. NICU Team took over care.  On auscultation HR was <60/min. Chest compressions given for about 1 min with continued PPV by mask. HR improved to 164/min after compressions. However, infant remained apneic and was only noted to take very rare shallow respirations.  Infant was bulb suctioned twice and obtained thick clear secretions. Air exchange was poor with PPV breaths in spite of increasing PIP to 30 mmHg, saturations remained at ~70%. At 5 min of age, he was intubated by Jacklynn Ganong, CNNP  on first attempt using a 4.0 ETT. Breath sounds clear and  equal on auscultation, additional confirmation with color change on CO2 monitor. ETT secured. Saturations improved to 90-95% after intubation. He was placed in transport isolette, shown to mom, and transferred to NICU. I spoke to mom briefly regarding infant's need for NICU care. MGM in attendance at delivery but not present on transfer.  Brief hx: FT, GBS neg, ROM for 28 hrs, no maternal fever, no antibiotics. HR stable until delivery.   Lucillie Garfinkel MD Neonatologist

## 2017-09-04 NOTE — Progress Notes (Signed)
   Heidi Rocha is a 26 y.o. G1P0 at [redacted]w[redacted]d  admitted for active labor, rupture of membranes  Subjective: Comfortable wepidural  Objective: Vitals:   09/03/17 2331 09/04/17 0001 09/04/17 0024 09/04/17 0031  BP: (!) 110/58 113/65  125/71  Pulse: (!) 111 (!) 117  (!) 108  Resp:   16   Temp:   98.5 F (36.9 C)   TempSrc:   Oral   SpO2:      Weight:      Height:       No intake/output data recorded.  FHT:  FHR: 145 bpm, variability: moderate,  accelerations:  Present,  decelerations:  Absent UC:   MVUs 200 SVE:   Dilation: 8 Effacement (%): 90 Station: 0 Exam by:: Rhunette Croft Pitocin @ 12 mu/min  Labs: Lab Results  Component Value Date   WBC 18.4 (H) 09/03/2017   HGB 11.3 (L) 09/03/2017   HCT 34.8 (L) 09/03/2017   MCV 81.5 09/03/2017   PLT 242 09/03/2017    Assessment / Plan: Augmentation of labor, progressing well  Labor: Progressing normally Fetal Wellbeing:  Category I Pain Control:  Epidural Anticipated MOD:  NSVD  CRESENZO-DISHMAN,Vanilla [place] 09/04/2017,IMMACULATE CRUTCHER12:47 AM

## 2017-09-04 NOTE — Anesthesia Pain Management Evaluation Note (Signed)
  CRNA Pain Management Visit Note  Patient: Heidi Rocha, 26 y.o., female  "Hello I am a member of the anesthesia team at [facility]. We have an anesthesia team available at all times to provide care throughout the hospital, including epidural management and anesthesia for C-section. I don't know your plan for the delivery whether it a natural birth, water birth, IV sedation, nitrous supplementation, doula or epidural, but we want to meet your pain goals."   1.Was your pain managed to your expectations on prior hospitalizations?   No prior hospitalizations  2.What is your expectation for pain management during this hospitalization?     Epidural  3.How can we help you reach that goal? Epidural in place.  Record the patient's initial score and the patient's pain goal.   Pain: 0  Pain Goal: 4-5 prior to epidural placement.  The [facility] wants you to be able to say your pain was always managed very well.  Aiden Helzer L 09/04/2017

## 2017-09-04 NOTE — Lactation Note (Signed)
This note was copied from a baby's chart. Lactation Consultation Note  Patient Name: Heidi Rocha Date: 09/04/2017    Mom preparing to go to the NICU. DEBP parts brought into room. Mom has my # to call when she returns from the NICU.   [name] [facility] 09/04/2017, 8:13 PM

## 2017-09-05 MED ORDER — COMPLETENATE 29-1 MG PO CHEW
1.0000 | CHEWABLE_TABLET | Freq: Every day | ORAL | Status: DC
  Administered 2017-09-05 – 2017-09-06 (×2): 1 via ORAL
  Filled 2017-09-05 (×3): qty 1

## 2017-09-05 NOTE — Anesthesia Postprocedure Evaluation (Signed)
Anesthesia Post Note  Patient: Heidi Rocha  Procedure(s) Performed: * No procedures listed *     Patient location during evaluation: Mother Baby Anesthesia Type: Epidural Level of consciousness: awake and alert and oriented Pain management: satisfactory to patient Vital Signs Assessment: post-procedure vital signs reviewed and stable Respiratory status: respiratory function stable Cardiovascular status: stable Postop Assessment: no headache, no backache, epidural receding, patient able to bend at knees, no signs of nausea or vomiting and adequate PO intake Anesthetic complications: no    Last Vitals:  Vitals:   09/04/17 2337 09/05/17 0641  BP:  109/64  Pulse:  67  Resp:  18  Temp: 36.5 C 36.6 C  SpO2:      Last Pain:  Vitals:   09/05/17 0908  TempSrc:   PainSc: 0-No pain   Pain Goal:                 [name]

## 2017-09-05 NOTE — Progress Notes (Signed)
Post Partum Day #1 Subjective: no complaints, up ad lib and tolerating PO; infant doing well in NICU per pt; thinking about pumping; undecided re contraception  Objective: Blood pressure 109/64, pulse 67, temperature 97.8 F (36.6 C), temperature source Oral, resp. rate 18, height  (1.575 m), weight 87.1 kg (192 lb), last menstrual period 11/27/2016, SpO2 100 %, unknown if currently breastfeeding.  Physical Exam:  General: alert, cooperative and no distress Lochia: appropriate Uterine Fundus: firm DVT Evaluation: No evidence of DVT seen on physical exam.   Recent Labs  09/03/17 1110  HGB 11.3*  HCT 34.8*    Assessment/Plan: Plan for discharge tomorrow   LOS: 2 days   Cam Hai CNM 09/05/2017, 9:37 AM

## 2017-09-06 MED ORDER — IBUPROFEN 100 MG/5ML PO SUSP: 600.0000 mg | Freq: Four times a day (QID) | ORAL | 2 refills | Status: DC

## 2017-09-06 NOTE — Lactation Note (Signed)
This note was copied from a baby's chart. Lactation Consultation Note  Patient Name: Heidi Rocha AVWPV'X Date: 09/06/2017 Reason for consult: Follow-up assessment  Baby 74 hours old. Mom reports that she has used DEBP once--yesterday--and did not see any colostrum flowing. Assisted mom with hand expression with no colostrum present. Discuss progression of milk coming to volume and supply and demand. Enc mom to pump every 2-3 hours for a total of 8-12 times/24 hours followed by hand expression. Mom aware of pumping rooms in NICU, and enc to take pumping kit at D/C. Reviewed EBM storage guidelines and discussed how to transport milk to NICU from home. Mom aware of OP/BFSG and [facility] phone line assistance after D/C.  Mom given paperwork for [facility] loaner pump and enc to call WIC to discuss getting a pump. Mom did not want this LC to send BF referral to [facility] office.   Maternal Data    Feeding Feeding Type: Formula Nipple Type: Slow - flow Length of feed: 20 min  LATCH Score                   Interventions    Lactation Tools Discussed/Used     Consult Status Consult Status: PRN    Andres Labrum 09/06/2017, 12:04 PM

## 2017-09-06 NOTE — Lactation Note (Signed)
This note was copied from a baby's chart. Lactation Consultation Note  Patient Name: Heidi Rocha [id number] Date: 09/06/2017 Reason for consult: Follow-up assessment  Mom given a [facility] loaner DEBP.   Maternal Data    Feeding    LATCH Score                   Interventions    Lactation Tools Discussed/Used     Consult Status Consult Status: PRN    [name] 09/06/2017, 1:05 PM

## 2017-09-06 NOTE — Discharge Summary (Signed)
OB Discharge Summary  Patient Name: Heidi Rocha DOB: [birth year] MRN: [id number]  Date of admission: 09/03/2017 Delivering MD: Rolm Bookbinder   Date of discharge: 09/06/2017  Admitting diagnosis: 40WKS, TO BE SEEN Intrauterine pregnancy: [redacted]w[redacted]d     Secondary diagnosis:Active Problems:   SVD (spontaneous vaginal delivery)  Additional problems:Fever     Discharge diagnosis: Term Pregnancy Delivered                                                                     Post partum procedures:none  Augmentation: Pitocin  Complications: Triple I  Hospital course:  Onset of Labor With Vaginal Delivery     26 y.o. yo G1P1001 at [redacted]w[redacted]d was admitted in Latent Labor on 09/03/2017. Patient had an uncomplicated labor course as follows:  Membrane Rupture Time/Date: 7:30 AM ,09/03/2017   Intrapartum Procedures: Episiotomy: None [1]                                         Lacerations:  Labial [10]  Patient had a delivery of a Viable infant. 09/04/2017  Information for the patient's newborn:  Heidi, Rocha [[id number]]  Delivery Method: Vaginal, Spontaneous Delivery (Filed from Delivery Summary)    Pateint had an uncomplicated postpartum course.  She is ambulating, tolerating a regular diet, passing flatus, and urinating well. Patient is discharged home in stable condition on 09/06/17.   Physical exam  Vitals:   09/04/17 2337 09/05/17 0641 09/05/17 1848 09/06/17 0606  BP:  109/64 115/63 100/62  Pulse:  67 70 64  Resp:  Temp: 97.7 F (36.5 C) 97.8 F (36.6 C) 98 F (36.7 C) 98.2 F (36.8 C)  TempSrc: Oral Oral Oral Oral  SpO2:    100%  Weight:      Height:       General: alert, cooperative and no distress Lochia: appropriate Uterine Fundus: firm Incision: N/A DVT Evaluation: No evidence of DVT seen on physical exam. Labs: Lab Results  Component Value Date   WBC 18.4 (H) 09/03/2017   HGB 11.3 (L) 09/03/2017   HCT 34.8 (L) 09/03/2017   MCV 81.5 09/03/2017    PLT 242 09/03/2017   No flowsheet data found.  Discharge instruction: per After Visit Summary and "Baby and Me Booklet".  After Visit Meds:  Allergies as of 09/06/2017      Reactions   Tuberculin Tests       Medication List    TAKE these medications   ibuprofen 100 MG/5ML suspension Commonly known as:  ADVIL,MOTRIN Take 30 mLs (600 mg total) by mouth every 6 (six) hours.   loratadine 10 MG tablet Commonly known as:  CLARITIN Take 1 tablet (10 mg total) by mouth daily.   VITAFOL GUMMIES 3.33-0.333-34.8 MG Chew Chew 3 each by mouth daily.            Discharge Care Instructions        Start     Ordered   09/06/17 0000  ibuprofen (ADVIL,MOTRIN) 100 MG/5ML suspension  Every 6 hours     09/06/17 0845   09/03/17 0000  OB RESULT CONSOLE  Group B Strep    Comments:  This external order was created through the Results Console.   09/03/17 0845      Diet: routine diet  Activity: Advance as tolerated. Pelvic rest for 6 weeks.   Outpatient follow up:6 weeks Follow up Appt:Future Appointments Date Time Provider Department Center  10/02/2017 8:45 AM Brock Bad, MD [facility] None   Follow up visit: No Follow-up on file.  Postpartum contraception: Undecided  Newborn Data: Live born female  Birth Weight: 8 lb 4.3 oz (3750 g) APGAR: 4, 5  Baby Feeding: Bottle Disposition:NICU   09/06/2017 Heidi Rocha, CNM

## 2017-09-07 ENCOUNTER — Ambulatory Visit: Payer: Self-pay

## 2017-09-07 NOTE — Lactation Note (Signed)
This note was copied from a baby's chart. Lactation Consultation Note  Patient Name: Heidi Rocha WGNFA'[id number] Date: 09/07/2017    I received a call from NICU nurse to come assist Mom in latching infant for the 1st time. Mom & baby are rooming in tonight. Mom's nipples are somewhat flat. A nipple shield was applied & prefilled with a little bit of formula to entice latch. Infant latched briefly & swallowed formula. A small amount of breast milk was noted in the nipple shield when infant released latch. Mom was shown how to apply NS.  After some conversation, Mom stated that she preferred to pump her milk and put it in a BO instead of latching infant at breast. Mom's milk has not come to volume. When I asked how much milk she was expressing, she stated "milliliters." Mom stated that today is the 1st day she has been pumping q3hrs. Only 1 feed of EBM noted in infant's chart (7 ml). Mom last pumped today around noon. She forgot to bring her DEBP parts with her to room-in, so I provided her with a manual pump & encouraged her to pump each breast 15 min q3h.   Infant will be f/u at [facility]. I encouraged Mom to ask for Chales Abrahams, [facility] if she has any issues or decides to learn how to latch infant.  [name] [facility] 09/07/2017, 9:41 PM

## 2017-09-10 ENCOUNTER — Telehealth: Payer: Self-pay

## 2017-09-10 NOTE — Telephone Encounter (Signed)
Returned call, no answer, left vm 

## 2017-09-10 NOTE — Telephone Encounter (Signed)
Attempted to contact pt again, mailbox full, unable to leave vm

## 2017-09-11 ENCOUNTER — Ambulatory Visit (INDEPENDENT_AMBULATORY_CARE_PROVIDER_SITE_OTHER): Payer: Medicaid Other | Admitting: Certified Nurse Midwife

## 2017-09-11 ENCOUNTER — Encounter: Payer: Self-pay | Admitting: Certified Nurse Midwife

## 2017-09-11 VITALS — BP 130/84 | HR 83 | Temp 99.4°F | Ht 63.0 in | Wt 178.0 lb

## 2017-09-11 DIAGNOSIS — M791 Myalgia, unspecified site: Secondary | ICD-10-CM

## 2017-09-11 NOTE — Progress Notes (Signed)
Patient ID: Heidi Rocha, female   DOB: 23-Dec-1990, 26 y.o.   MRN: [id number]  No chief complaint on file.   HPI ANAE Rocha is a 26 y.o. female.  Here for lower leg edema, low grade fever, HA.  Started with fever in hospital.  Delivered 09/04/17, NSVD with infant going to NICU for code APGAR.  Infant went home earlier this week.  Started with HA off an on this week.  Feels "Flu-ish".  Mask given to patient and educated to wear it around the baby.  Is pumping currently, encouraged to keep up the pumping and try to latch baby.  Denies any breast symptoms.  No mention of fever patient stated in records during delivery or postpartum.  Slight fever today.  Nasal swab obtained to rule out viral infection. Educated to go to urgent care over the weekend if symptoms are worse.      HPI  Past Medical History:  Diagnosis Date  . Medical history non-contributory     Past Surgical History:  Procedure Laterality Date  . NO PAST SURGERIES      History reviewed. No pertinent family history.  Social History Social History  Substance Use Topics  . Smoking status: Never Smoker  . Smokeless tobacco: Never Used  . Alcohol use No    Allergies  Allergen Reactions  . Tuberculin Tests     Current Outpatient Prescriptions  Medication Sig Dispense Refill  . ibuprofen (ADVIL,MOTRIN) 100 MG/5ML suspension Take 30 mLs (600 mg total) by mouth every 6 (six) hours. 250 mL 2  . Prenatal Vit-Fe Phos-FA-Omega (VITAFOL GUMMIES) 3.33-0.333-34.8 MG CHEW Chew 3 each by mouth daily. 90 tablet 11  . loratadine (CLARITIN) 10 MG tablet Take 1 tablet (10 mg total) by mouth daily. (Patient not taking: Reported on 09/11/2017) 30 tablet 11   No current facility-administered medications for this visit.     Review of Systems Review of Systems Constitutional: negative for fatigue and weight loss Respiratory: negative for cough and wheezing Cardiovascular: negative for chest pain, fatigue and  palpitations Gastrointestinal: negative for abdominal pain and change in bowel habits Genitourinary:negative Integument/breast: negative for nipple discharge Musculoskeletal:negative for myalgias Neurological: negative for gait problems and tremors Behavioral/Psych: negative for abusive relationship, depression Endocrine: negative for temperature intolerance      Blood pressure 130/84, pulse 83, temperature 99.4 F (37.4 C), height  (1.6 m), weight 178 lb (80.7 kg), last menstrual period 11/27/2016, currently breastfeeding.  Physical Exam Physical Exam General:   alert  Skin:   no rash or abnormalities  Lungs:   clear to auscultation bilaterally  Heart:   regular rate and rhythm, S1, S2 normal, no murmur, click, rub or gallop  Breasts:   deferred  Abdomen:  normal findings: no organomegaly, soft, non-tender and no hernia  Pelvis: Deferred    50% of 15 min visit spent on counseling and coordination of care.    Data Reviewed Previous medical records, meds, labs  Assessment     S/P NSVD Probable viral infection    Plan    Orders Placed This Encounter  Procedures  . Virus culture    Order Specific Question:   Source    Answer:   nasal     Follow up with routine postpartum care.

## 2017-09-11 NOTE — Progress Notes (Signed)
Pt c/o swelling in feet. SVD 08/04/17. Pt c/o body aches - Temp 99.4. Denies visual disturbances and HA's.

## 2017-09-11 NOTE — Addendum Note (Signed)
Addended by: Dalphine Handing on: 09/11/2017 10:32 AM   Modules accepted: Orders

## 2017-09-15 ENCOUNTER — Other Ambulatory Visit: Payer: Self-pay | Admitting: Certified Nurse Midwife

## 2017-09-15 LAB — VIRAL CULT, RAPID, RESPIRATORY
Adenovirus: NEGATIVE
Influenza A: NEGATIVE
Influenza B: NEGATIVE
Parainfluenza 1: NEGATIVE
Parainfluenza 2: NEGATIVE
Parainfluenza 3: NEGATIVE
RESPIRATORY SYNCYTIAL VIRUS: NEGATIVE

## 2017-09-30 ENCOUNTER — Encounter: Payer: Self-pay | Admitting: Obstetrics

## 2017-09-30 ENCOUNTER — Ambulatory Visit (INDEPENDENT_AMBULATORY_CARE_PROVIDER_SITE_OTHER): Payer: Medicaid Other | Admitting: Obstetrics

## 2017-09-30 DIAGNOSIS — Z3009 Encounter for other general counseling and advice on contraception: Secondary | ICD-10-CM

## 2017-09-30 DIAGNOSIS — Z1389 Encounter for screening for other disorder: Secondary | ICD-10-CM

## 2017-09-30 DIAGNOSIS — Z30011 Encounter for initial prescription of contraceptive pills: Secondary | ICD-10-CM

## 2017-09-30 MED ORDER — NORETHINDRONE 0.35 MG PO TABS: 1.0000 | ORAL_TABLET | Freq: Every day | ORAL | 11 refills | Status: DC

## 2017-09-30 NOTE — Progress Notes (Signed)
Post Partum Exam  Heidi BravoJermey M Rocha is a 26 y.o. 221P1001 female who presents for a postpartum visit. She is 4 weeks postpartum following a spontaneous vaginal delivery. I have fully reviewed the prenatal and intrapartum course. The delivery was at 40 w 1d gestational weeks.  Anesthesia: epidural. Postpartum course has been good. Baby's course has been good. Baby is feeding by both breast and bottle - Carn[employer] Good Start. Bleeding staining only. Bowel function is normal. Bladder function is normal. Patient is not sexually active. Contraception method is OCP (estrogen/progesterone). Postpartum depression screening: SCORE WAS 0  The following portions of the patient's history were reviewed and updated as appropriate: allergies, current medications, past family history, past medical history, past social history, past surgical history and problem list.  Review of Systems A comprehensive review of systems was negative.    Objective:  currently breastfeeding.  General:  alert and no distress   Breasts:  inspection negative, no nipple discharge or bleeding, no masses or nodularity palpable  Lungs: clear to auscultation bilaterally  Heart:  regular rate and rhythm, S1, S2 normal, no murmur, click, rub or gallop  Abdomen: soft, non-tender; bowel sounds normal; no masses,  no organomegaly   Vulva:  normal  Vagina: normal vagina  Cervix:  no lesions  Corpus: normal size, contour, position, consistency, mobility, non-tender  Adnexa:  no mass, fullness, tenderness  Rectal Exam: Not performed.        Assessment:   1. Postpartum care following vaginal delivery - doing well  2. Encounter for other general counseling and advice on contraception - wants Micronor.  She is breastfeeding.  3. Encounter for initial prescription of contraceptive pills Rx: - norethindrone (MICRONOR,CAMILA,ERRIN) 0.35 MG tablet; Take 1 tablet (0.35 mg total) by mouth daily.  Dispense: 1 Package; Refill: 11   Plan:   1.  Contraception: oral progesterone-only contraceptive 2. Micronor Rx 3. Follow up in: 6 months or as needed.    Jene Huq A. Clearance CootsHarper MD

## 2017-10-02 ENCOUNTER — Ambulatory Visit: Payer: Medicaid Other | Admitting: Obstetrics

## 2018-03-23 ENCOUNTER — Encounter: Payer: Self-pay | Admitting: Obstetrics

## 2018-03-23 ENCOUNTER — Other Ambulatory Visit (HOSPITAL_COMMUNITY)
Admission: RE | Admit: 2018-03-23 | Discharge: 2018-03-23 | Disposition: A | Discharge status: Home / Self Care | Payer: BLUE CROSS/BLUE SHIELD | Source: Ambulatory Visit | Attending: Obstetrics | Admitting: Obstetrics

## 2018-03-23 ENCOUNTER — Ambulatory Visit (INDEPENDENT_AMBULATORY_CARE_PROVIDER_SITE_OTHER): Payer: BLUE CROSS/BLUE SHIELD | Admitting: Obstetrics

## 2018-03-23 VITALS — BP 114/78 | HR 80 | Ht 62.0 in | Wt 163.0 lb

## 2018-03-23 DIAGNOSIS — Z113 Encounter for screening for infections with a predominantly sexual mode of transmission: Secondary | ICD-10-CM | POA: Insufficient documentation

## 2018-03-23 DIAGNOSIS — Z01419 Encounter for gynecological examination (general) (routine) without abnormal findings: Secondary | ICD-10-CM

## 2018-03-23 DIAGNOSIS — J301 Allergic rhinitis due to pollen: Secondary | ICD-10-CM

## 2018-03-23 DIAGNOSIS — Z309 Encounter for contraceptive management, unspecified: Secondary | ICD-10-CM

## 2018-03-23 DIAGNOSIS — Z3041 Encounter for surveillance of contraceptive pills: Secondary | ICD-10-CM

## 2018-03-23 MED ORDER — LORATADINE 10 MG PO TABS: 10.0000 mg | ORAL_TABLET | Freq: Every day | ORAL | 11 refills | Status: DC

## 2018-03-23 NOTE — Progress Notes (Signed)
Subjective:        Heidi Rocha is a 27 y.o. female here for a routine exam.  Current complaints: Seasonal allergies.  Personal health questionnaire:  Is patient Ashkenazi Jewish, have a family history of breast and/or ovarian cancer: no Is there a family history of uterine cancer diagnosed at age < 3050, gastrointestinal cancer, urinary tract cancer, family member who is a Personnel officerLynch syndrome-associated carrier: no Is the patient overweight and hypertensive, family history of diabetes, personal history of gestational diabetes, preeclampsia or PCOS: no Is patient over 4355, have PCOS,  family history of premature CHD under age 25, diabetes, smoke, have hypertension or peripheral artery disease:  no At any time, has a partner hit, kicked or otherwise hurt or frightened you?: no Over the past 2 weeks, have you felt down, depressed or hopeless?: no Over the past 2 weeks, have you felt little interest or pleasure in doing things?:no   Gynecologic History Patient's last menstrual period was 03/15/2018. Contraception: oral progesterone-only contraceptive Last Pap: 2018. Results were: normal Last mammogram: n/a. Results were: n/a  Obstetric History OB History  Gravida Para Term Preterm AB Living  1 1 1     1   SAB TAB Ectopic Multiple Live Births        0 1    # Outcome Date GA Lbr Len/2nd Weight Sex Delivery Anes PTL Lv  1 Term 09/04/17 [id number] 23:25 / 05:03 8 lb 4.3 oz (3.75 kg) M Vag-Spont EPI  LIV    Past Medical History:  Diagnosis Date  . Medical history non-contributory     Past Surgical History:  Procedure Laterality Date  . NO PAST SURGERIES       Current Outpatient Medications:  .  ibuprofen (ADVIL,MOTRIN) 100 MG/5ML suspension, Take 30 mLs (600 mg total) by mouth every 6 (six) hours., Disp: 250 mL, Rfl: 2 .  loratadine (CLARITIN) 10 MG tablet, Take 1 tablet (10 mg total) by mouth daily., Disp: 30 tablet, Rfl: 11 .  loratadine (CLARITIN) 10 MG tablet, Take 1 tablet (10  mg total) by mouth daily., Disp: 30 tablet, Rfl: 11 .  norethindrone (MICRONOR,CAMILA,ERRIN) 0.35 MG tablet, Take 1 tablet (0.35 mg total) by mouth daily., Disp: 1 Package, Rfl: 11 .  Prenatal Vit-Fe Phos-FA-Omega (VITAFOL GUMMIES) 3.33-0.333-34.8 MG CHEW, Chew 3 each by mouth daily., Disp: 90 tablet, Rfl: 11 Allergies  Allergen Reactions  . Tuberculin Tests     Social History   Tobacco Use  . Smoking status: Never Smoker  . Smokeless tobacco: Never Used  Substance Use Topics  . Alcohol use: No    History reviewed. No pertinent family history.    Review of Systems  Constitutional: negative for fatigue and weight loss Respiratory: negative for cough and wheezing Cardiovascular: negative for chest pain, fatigue and palpitations Gastrointestinal: negative for abdominal pain and change in bowel habits Musculoskeletal:negative for myalgias Neurological: negative for gait problems and tremors Behavioral/Psych: negative for abusive relationship, depression Endocrine: negative for temperature intolerance    Genitourinary:negative for abnormal menstrual periods, genital lesions, hot flashes, sexual problems and vaginal discharge Integument/breast: negative for breast lump, breast tenderness, nipple discharge and skin lesion(s)    Objective:       BP 114/78   Pulse 80   Ht 5\' 2"  (1.575 m)   Wt 163 lb (73.9 kg)   LMP 03/15/2018   BMI 29.81 kg/m  General:   alert  Skin:   no rash or abnormalities  Lungs:   clear to  auscultation bilaterally  Heart:   regular rate and rhythm, S1, S2 normal, no murmur, click, rub or gallop  Breasts:   normal without suspicious masses, skin or nipple changes or axillary nodes  Abdomen:  normal findings: no organomegaly, soft, non-tender and no hernia  Pelvis:  External genitalia: normal general appearance Urinary system: urethral meatus normal and bladder without fullness, nontender Vaginal: normal without tenderness, induration or masses Cervix:  normal appearance Adnexa: normal bimanual exam Uterus: anteverted and non-tender, normal size   Lab Review Urine pregnancy test Labs reviewed yes Radiologic studies reviewed no  50% of 20 min visit spent on counseling and coordination of care.   Assessment:     1. Encounter for routine gynecological examination with Papanicolaou smear of cervix Rx: - Cytology - PAP  2. Screen for STD (sexually transmitted disease) Rx: - Cervicovaginal ancillary only  3. Encounter for surveillance of contraceptive pills -breast feeding.  Taking Micronor.  4. Seasonal allergies - Claritin Rx    Plan:    Education reviewed: calcium supplements, depression evaluation, low fat, low cholesterol diet, safe sex/STD prevention, self breast exams and weight bearing exercise. Follow up in: 1 year.   Meds ordered this encounter  Medications  . loratadine (CLARITIN) 10 MG tablet    Sig: Take 1 tablet (10 mg total) by mouth daily.    Dispense:  30 tablet    Refill:  11   No orders of the defined types were placed in this encounter.   Brock Bad MD 03-24-2015

## 2018-03-24 LAB — CERVICOVAGINAL ANCILLARY ONLY
Chlamydia: NEGATIVE
NEISSERIA GONORRHEA: NEGATIVE

## 2018-03-26 LAB — CYTOLOGY - PAP: DIAGNOSIS: NEGATIVE

## 2018-05-31 ENCOUNTER — Telehealth: Payer: Self-pay

## 2018-05-31 ENCOUNTER — Other Ambulatory Visit: Payer: Self-pay | Admitting: Obstetrics

## 2018-05-31 DIAGNOSIS — Z3041 Encounter for surveillance of contraceptive pills: Secondary | ICD-10-CM

## 2018-05-31 MED ORDER — DROSPIRENONE-ETHINYL ESTRADIOL 3-0.02 MG PO TABS: 1.0000 | ORAL_TABLET | Freq: Every day | ORAL | 11 refills | Status: DC

## 2018-05-31 NOTE — Telephone Encounter (Signed)
Returned call, no answer, left vm 

## 2018-05-31 NOTE — Telephone Encounter (Signed)
Pt called in and stated that she has stopped breastfeeding and would like to change to a different BC pill.

## 2019-03-04 ENCOUNTER — Telehealth: Payer: Self-pay

## 2019-03-04 ENCOUNTER — Other Ambulatory Visit: Payer: Self-pay | Admitting: Obstetrics

## 2019-03-04 DIAGNOSIS — Z30016 Encounter for initial prescription of transdermal patch hormonal contraceptive device: Secondary | ICD-10-CM

## 2019-03-04 MED ORDER — NORELGESTROMIN-ETH ESTRADIOL 150-35 MCG/24HR TD PTWK: 1.0000 | MEDICATED_PATCH | TRANSDERMAL | 12 refills | Status: DC

## 2019-03-04 NOTE — Telephone Encounter (Signed)
Patient wants to change Birth control to patch AEX due 03/24/19 but will be postponed due to COVID please advise.

## 2019-03-04 NOTE — Telephone Encounter (Signed)
Return call to pt regarding message  Pt requesting changes in birth control  LVMOM pt was not ava.

## 2019-03-04 NOTE — Telephone Encounter (Signed)
Xulane Patch Rx

## 2019-03-07 NOTE — Telephone Encounter (Signed)
Message sent to pt my chart to make aware.

## 2019-10-25 ENCOUNTER — Other Ambulatory Visit: Payer: Self-pay | Admitting: Obstetrics

## 2019-10-25 ENCOUNTER — Telehealth: Payer: Self-pay

## 2019-10-25 DIAGNOSIS — Z30011 Encounter for initial prescription of contraceptive pills: Secondary | ICD-10-CM

## 2019-10-25 DIAGNOSIS — R21 Rash and other nonspecific skin eruption: Secondary | ICD-10-CM

## 2019-10-25 MED ORDER — NORETHIN ACE-ETH ESTRAD-FE 1-20 MG-MCG(24) PO TABS: 1.0000 | ORAL_TABLET | Freq: Every day | ORAL | 11 refills | Status: DC

## 2019-10-25 MED ORDER — HYDROCORTISONE 0.5 % EX CREA: 1.0000 "application " | TOPICAL_CREAM | Freq: Two times a day (BID) | CUTANEOUS | 0 refills | Status: DC

## 2019-10-25 NOTE — Telephone Encounter (Signed)
Pt requesting to change back to birth control pill from patch.  Pt also requesting a cream for skin irritation due to patch

## 2019-12-09 NOTE — L&D Delivery Note (Signed)
OB/GYN Faculty Practice Delivery Note  [name] is a 29 y.o. [id number] s/p SVD at [redacted]w[redacted]d. She was admitted for spontaneous labor.   ROM: 46h 48m with thick meconium fluid GBS Status: Positive/-- (09/28 0348) Maximum Maternal Temperature: 37.8  Labor Progress: . Initial SVE: 1/50/-2. She then progressed to complete.   Delivery Date/Time: 10/21 at 1408  Delivery: Called to room by RN and told that patient had already delivered 4-5 minutes prior to my arrival. Infants cord had been cut and clamped, newborn at warmer prior to my arrival. Cord blood drawn. Placenta delivered spontaneously with gentle cord traction. Fundus firm with massage and Pitocin. Labia, perineum, vagina, and cervix inspected inspected with no lacerations.  Baby Weight: pending  Placenta: Sent to pathology Complications: Delivery was not attended  Lacerations: none EBL: 50 mL Analgesia: Epidural   Infant:  APGAR (1 MIN):  pending APGAR (5 MINS):   APGAR (10 MINS):     Casper Harrison, MD [facility] Family Medicine Fellow, [facility] for [facility], [facility] 09/27/2020, 2:39 PM

## 2019-12-23 ENCOUNTER — Other Ambulatory Visit: Payer: Self-pay

## 2019-12-23 ENCOUNTER — Ambulatory Visit (INDEPENDENT_AMBULATORY_CARE_PROVIDER_SITE_OTHER): Payer: Medicaid Other

## 2019-12-23 DIAGNOSIS — Z3202 Encounter for pregnancy test, result negative: Secondary | ICD-10-CM

## 2019-12-23 LAB — POCT URINE PREGNANCY: Preg Test, Ur: NEGATIVE

## 2019-12-23 NOTE — Progress Notes (Signed)
Heidi Rocha presents today for UPT. She has no unusual complaints and have not had a period since October.  LMP:09/2019    OBJECTIVE: Appears well, in no apparent distress.  OB History    Gravida  1   Para  1   Term  1   Preterm      AB      Living  1     SAB      TAB      Ectopic      Multiple  0   Live Births  1          Home UPT Result:NEGATIVE X2 In-Office UPT result: NEGATIVE I have reviewed the patient's medical, obstetrical, social, and family histories, and medications.   ASSESSMENT: Positive pregnancy test  PLAN Prenatal care to be completed at: NA

## 2020-02-29 ENCOUNTER — Ambulatory Visit (INDEPENDENT_AMBULATORY_CARE_PROVIDER_SITE_OTHER): Payer: Medicaid Other | Admitting: *Deleted

## 2020-02-29 ENCOUNTER — Other Ambulatory Visit: Payer: Self-pay

## 2020-02-29 ENCOUNTER — Encounter: Payer: Self-pay | Admitting: Obstetrics

## 2020-02-29 DIAGNOSIS — Z3480 Encounter for supervision of other normal pregnancy, unspecified trimester: Secondary | ICD-10-CM

## 2020-02-29 DIAGNOSIS — Z3200 Encounter for pregnancy test, result unknown: Secondary | ICD-10-CM

## 2020-02-29 DIAGNOSIS — Z3201 Encounter for pregnancy test, result positive: Secondary | ICD-10-CM | POA: Diagnosis not present

## 2020-02-29 LAB — POCT URINE PREGNANCY: Preg Test, Ur: POSITIVE — AB

## 2020-02-29 NOTE — Progress Notes (Signed)
Ms. Hoots presents today for UPT. She has no unusual complaints.  LMP:12/27/2019 EDD: 10/02/2020    OBJECTIVE: Appears well, in no apparent distress.  OB History    Gravida  1   Para  1   Term  1   Preterm      AB      Living  1     SAB      TAB      Ectopic      Multiple  0   Live Births  1          Home UPT Result:Positive In-Office UPT result:Positive  I have reviewed the patient's medical, obstetrical, social, and family histories, and medications.   ASSESSMENT: Positive pregnancy test  PLAN Prenatal care to be completed at: [facility] NOB intake completed today.

## 2020-02-29 NOTE — Progress Notes (Signed)
Patient ID: Heidi Rocha, female   DOB: 09/20/1991, 29 y.o.   MRN: [id number] Patient seen and assessed by nursing staff during this encounter. I have reviewed the chart and agree with the documentation and plan. I have also made any necessary editorial changes.  Scheryl Darter, MD 02/29/2020 4:45 PM

## 2020-03-15 ENCOUNTER — Ambulatory Visit (INDEPENDENT_AMBULATORY_CARE_PROVIDER_SITE_OTHER): Payer: Medicaid Other | Admitting: Obstetrics

## 2020-03-15 ENCOUNTER — Encounter: Payer: Self-pay | Admitting: Obstetrics

## 2020-03-15 ENCOUNTER — Other Ambulatory Visit (HOSPITAL_COMMUNITY)
Admission: RE | Admit: 2020-03-15 | Discharge: 2020-03-15 | Disposition: A | Discharge status: Home / Self Care | Payer: Medicaid Other | Source: Ambulatory Visit | Attending: Obstetrics | Admitting: Obstetrics

## 2020-03-15 ENCOUNTER — Other Ambulatory Visit: Payer: Self-pay

## 2020-03-15 VITALS — BP 125/73 | HR 84 | Wt 180.5 lb

## 2020-03-15 DIAGNOSIS — Z3480 Encounter for supervision of other normal pregnancy, unspecified trimester: Secondary | ICD-10-CM | POA: Diagnosis not present

## 2020-03-15 DIAGNOSIS — Z3A11 11 weeks gestation of pregnancy: Secondary | ICD-10-CM

## 2020-03-15 DIAGNOSIS — Z3481 Encounter for supervision of other normal pregnancy, first trimester: Secondary | ICD-10-CM | POA: Diagnosis not present

## 2020-03-15 NOTE — Progress Notes (Signed)
Patient presents for NOB. Patient states that this was not a planned pregnancy. She and FOB does live together. She plans on breast/bottle feeding.  Last pap: 03/23/2018 Normal

## 2020-03-15 NOTE — Progress Notes (Signed)
Subjective:    KAMAILE ZACHOW is being seen today for her first obstetrical visit.  This is not a planned pregnancy. She is at [redacted]w[redacted]d gestation. Her obstetrical history is significant for none. Relationship with FOB: significant other, not living together. Patient does intend to breast feed. Pregnancy history fully reviewed.  The information documented in the HPI was reviewed and verified.  Menstrual History: OB History    Gravida  2   Para  1   Term  1   Preterm      AB      Living  1     SAB      TAB      Ectopic      Multiple  0   Live Births  1            Patient's last menstrual period was 12/27/2019.    Past Medical History:  Diagnosis Date  . Medical history non-contributory     Past Surgical History:  Procedure Laterality Date  . NO PAST SURGERIES      (Not in a hospital admission)  Allergies  Allergen Reactions  . Tuberculin Tests     Social History   Tobacco Use  . Smoking status: Never Smoker  . Smokeless tobacco: Never Used  Substance Use Topics  . Alcohol use: No    History reviewed. No pertinent family history.   Review of Systems Constitutional: negative for weight loss Gastrointestinal: negative for vomiting Genitourinary:negative for genital lesions and vaginal discharge and dysuria Musculoskeletal:negative for back pain Behavioral/Psych: negative for abusive relationship, depression, illegal drug usage and tobacco use    Objective:    BP 125/73   Pulse 84   Wt 180 lb 8 oz (81.9 kg)   LMP 12/27/2019   BMI 33.01 kg/m  General Appearance:    Alert, cooperative, no distress, appears stated age  Head:    Normocephalic, without obvious abnormality, atraumatic  Eyes:    PERRL, conjunctiva/corneas clear, EOM's intact, fundi    benign, both eyes  Ears:    Normal TM's and external ear canals, both ears  Nose:   Nares normal, septum midline, mucosa normal, no drainage    or sinus tenderness  Throat:   Lips, mucosa, and  tongue normal; teeth and gums normal  Neck:   Supple, symmetrical, trachea midline, no adenopathy;    thyroid:  no enlargement/tenderness/nodules; no carotid   bruit or JVD  Back:     Symmetric, no curvature, ROM normal, no CVA tenderness  Lungs:     Clear to auscultation bilaterally, respirations unlabored  Chest Wall:    No tenderness or deformity   Heart:    Regular rate and rhythm, S1 and S2 normal, no murmur, rub   or gallop  Breast Exam:    No tenderness, masses, or nipple abnormality  Abdomen:     Soft, non-tender, bowel sounds active all four quadrants,    no masses, no organomegaly  Genitalia:    Normal female without lesion, discharge or tenderness  Extremities:   Extremities normal, atraumatic, no cyanosis or edema  Pulses:   2+ and symmetric all extremities  Skin:   Skin color, texture, turgor normal, no rashes or lesions  Lymph nodes:   Cervical, supraclavicular, and axillary nodes normal  Neurologic:   CNII-XII intact, normal strength, sensation and reflexes    throughout      Lab Review Urine pregnancy test Labs reviewed yes Radiologic studies reviewed no  Assessment:  Pregnancy at [redacted]w[redacted]d weeks    Plan:      Prenatal vitamins.  Counseling provided regarding continued use of seat belts, cessation of alcohol consumption, smoking or use of illicit drugs; infection precautions i.e., influenza/TDAP immunizations, toxoplasmosis,CMV, parvovirus, listeria and varicella; workplace safety, exercise during pregnancy; routine dental care, safe medications, sexual activity, hot tubs, saunas, pools, travel, caffeine use, fish and methlymercury, potential toxins, hair treatments, varicose veins Weight gain recommendations per IOM guidelines reviewed: underweight/BMI< 18.5--> gain 28 - 40 lbs; normal weight/BMI 18.5 - 24.9--> gain 25 - 35 lbs; overweight/BMI 25 - 29.9--> gain 15 - 25 lbs; obese/BMI >30->gain  11 - 20 lbs Problem list reviewed and updated. FIRST/CF mutation  testing/NIPT/QUAD SCREEN/fragile X/Ashkenazi Jewish population testing/Spinal muscular atrophy discussed: requested. Role of ultrasound in pregnancy discussed; fetal survey: requested. Amniocentesis discussed: not indicated.  No orders of the defined types were placed in this encounter.  Orders Placed This Encounter  Procedures  . Culture, OB Urine  . Obstetric Panel, Including HIV  . Genetic Screening    Follow up in 4 weeks. 50% of 25 min visit spent on counseling and coordination of care.    Shelly Bombard, MD 03/15/2020 12:18 PM

## 2020-03-16 LAB — CERVICOVAGINAL ANCILLARY ONLY
Bacterial Vaginitis (gardnerella): POSITIVE — AB
Candida Glabrata: NEGATIVE
Candida Vaginitis: NEGATIVE
Chlamydia: NEGATIVE
Comment: NEGATIVE
Comment: NEGATIVE
Comment: NEGATIVE
Comment: NEGATIVE
Comment: NEGATIVE
Comment: NORMAL
Neisseria Gonorrhea: NEGATIVE
Trichomonas: NEGATIVE

## 2020-03-16 LAB — OBSTETRIC PANEL, INCLUDING HIV
Antibody Screen: NEGATIVE
Basophils Absolute: 0 10*3/uL (ref 0.0–0.2)
Basos: 0 %
EOS (ABSOLUTE): 0.1 10*3/uL (ref 0.0–0.4)
Eos: 1 %
HIV Screen 4th Generation wRfx: NONREACTIVE
Hematocrit: 37.6 % (ref 34.0–46.6)
Hemoglobin: 12.1 g/dL (ref 11.1–15.9)
Hepatitis B Surface Ag: NEGATIVE
Immature Grans (Abs): 0.1 10*3/uL (ref 0.0–0.1)
Immature Granulocytes: 1 %
Lymphocytes Absolute: 2.5 10*3/uL (ref 0.7–3.1)
Lymphs: 24 %
MCH: 28.4 pg (ref 26.6–33.0)
MCHC: 32.2 g/dL (ref 31.5–35.7)
MCV: 88 fL (ref 79–97)
Monocytes Absolute: 0.8 10*3/uL (ref 0.1–0.9)
Monocytes: 8 %
Neutrophils Absolute: 7 10*3/uL (ref 1.4–7.0)
Neutrophils: 66 %
Platelets: 297 10*3/uL (ref 150–450)
RBC: 4.26 x10E6/uL (ref 3.77–5.28)
RDW: 13.4 % (ref 11.7–15.4)
RPR Ser Ql: NONREACTIVE
Rh Factor: POSITIVE
Rubella Antibodies, IGG: 3.77 index (ref >0.99)
WBC: 10.5 10*3/uL (ref 3.4–10.8)

## 2020-03-17 ENCOUNTER — Other Ambulatory Visit: Payer: Self-pay | Admitting: Obstetrics

## 2020-03-17 DIAGNOSIS — B9689 Other specified bacterial agents as the cause of diseases classified elsewhere: Secondary | ICD-10-CM

## 2020-03-17 LAB — URINE CULTURE, OB REFLEX

## 2020-03-17 LAB — CULTURE, OB URINE

## 2020-03-17 MED ORDER — TINIDAZOLE 500 MG PO TABS: 1000.0000 mg | ORAL_TABLET | Freq: Every day | ORAL | 2 refills | Status: DC

## 2020-03-22 ENCOUNTER — Encounter: Payer: Self-pay | Admitting: Obstetrics

## 2020-03-26 ENCOUNTER — Encounter: Payer: Self-pay | Admitting: Obstetrics

## 2020-03-27 ENCOUNTER — Other Ambulatory Visit: Payer: Self-pay

## 2020-03-27 ENCOUNTER — Other Ambulatory Visit: Payer: Self-pay | Admitting: Obstetrics

## 2020-03-27 DIAGNOSIS — J301 Allergic rhinitis due to pollen: Secondary | ICD-10-CM

## 2020-03-27 DIAGNOSIS — Z3480 Encounter for supervision of other normal pregnancy, unspecified trimester: Secondary | ICD-10-CM

## 2020-03-27 MED ORDER — VITAFOL GUMMIES 3.33-0.333-34.8 MG PO CHEW: 3.0000 | CHEWABLE_TABLET | Freq: Every day | ORAL | 11 refills | Status: AC

## 2020-03-27 MED ORDER — CETIRIZINE HCL 10 MG PO CHEW: 10.0000 mg | CHEWABLE_TABLET | Freq: Every day | ORAL | 11 refills | Status: DC

## 2020-03-27 NOTE — Telephone Encounter (Signed)
Citirizine Rx for allergies

## 2020-03-28 MED ORDER — LORATADINE 10 MG PO TABS: 10.0000 mg | ORAL_TABLET | Freq: Every day | ORAL | 11 refills | Status: DC

## 2020-03-28 NOTE — Telephone Encounter (Signed)
Gummies are chewable, and they should be covered.

## 2020-04-12 ENCOUNTER — Telehealth (INDEPENDENT_AMBULATORY_CARE_PROVIDER_SITE_OTHER): Payer: Medicaid Other | Admitting: Nurse Practitioner

## 2020-04-12 ENCOUNTER — Encounter: Payer: Self-pay | Admitting: Nurse Practitioner

## 2020-04-12 ENCOUNTER — Telehealth: Payer: Medicaid Other | Admitting: Nurse Practitioner

## 2020-04-12 DIAGNOSIS — Z3480 Encounter for supervision of other normal pregnancy, unspecified trimester: Secondary | ICD-10-CM

## 2020-04-12 MED ORDER — GOJJI WEIGHT SCALE MISC: 1.0000 | Freq: Once | 0 refills | Status: AC

## 2020-04-12 MED ORDER — BLOOD PRESSURE KIT DEVI: 1.0000 | 0 refills | Status: DC | PRN

## 2020-04-12 NOTE — Progress Notes (Signed)
I connected with@ on 04/12/20 at  1:20 PM EDT by: MyChart and verified that I am speaking with the correct person using two identifiers.  Patient is located at work and provider is located at [facility].     The purpose of this virtual visit is to provide medical care while limiting exposure to the novel coronavirus. I discussed the limitations, risks, security and privacy concerns of performing an evaluation and management service by MyChart and the availability of in person appointments. I also discussed with the patient that there may be a patient responsible charge related to this service. By engaging in this virtual visit, you consent to the provision of healthcare.  Additionally, you authorize for your insurance to be billed for the services provided during this visit.  The patient expressed understanding and agreed to proceed.  The following staff members participated in the virtual visit:  Lewayne Bunting, CMA and Nolene Bernheim, NP    PRENATAL VISIT NOTE  Subjective:  Heidi Rocha is a 29 y.o. G2P1001 at [redacted]w[redacted]d  for phone visit for ongoing prenatal care.  She is currently monitored for the following issues for this low-risk pregnancy and has Supervision of other normal pregnancy, antepartum on their problem list.  Patient reports low back pain on the left side only.  Contractions: Not present. Vag. Bleeding: None.  Movement: Absent. Denies leaking of fluid.   The following portions of the patient's history were reviewed and updated as appropriate: allergies, current medications, past family history, past medical history, past social history, past surgical history and problem list.   Objective:  There were no vitals filed for this visit. Self-Obtained  Fetal Status:     Movement: Absent     Assessment and Plan:  Pregnancy: G2P1001 at [redacted]w[redacted]d 1. Supervision of other normal pregnancy, antepartum Reviewed possible causes of back pain - likely her carrying her 27 year old on her left hip  is a likely cause. Advised to take Tylenol by the package directions. Could consider belly support band but since the pain is only on one side, unsure if the band would help her current problem and did not order today.  Will consider again in the future if pain continues. Ordered BP cuff and scale - client to pick up from the pharmacy Wants AFP done - will order in 2 weeks  - [place] MFM OB COMP + 14 WK; Future  Preterm labor symptoms and general obstetric precautions including but not limited to vaginal bleeding, contractions, leaking of fluid and fetal movement were reviewed in detail with the patient.  Return in about 2 weeks (around 04/26/2020) for AFP only visit in 2 weeks AND virtual ROB in 4 weeks.  No future appointments.   Time spent on virtual visit: 6 minutes  Currie Paris, NP

## 2020-04-12 NOTE — Progress Notes (Signed)
Virtual Visit via Telephone Note  I connected with Heidi Rocha on 04/12/20 at  1:20 PM EDT by telephone and verified that I am speaking with the correct person using two identifiers.  BP cuff and scale sent to pharmacy today No concerns per pt

## 2020-04-26 ENCOUNTER — Other Ambulatory Visit: Payer: Self-pay

## 2020-04-26 ENCOUNTER — Other Ambulatory Visit: Payer: Medicaid Other

## 2020-04-26 DIAGNOSIS — Z3480 Encounter for supervision of other normal pregnancy, unspecified trimester: Secondary | ICD-10-CM

## 2020-04-28 LAB — AFP, SERUM, OPEN SPINA BIFIDA
AFP MoM: 0.66
AFP Value: 25.9 ng/mL
Gest. Age on Collection Date: 17.1 weeks
Maternal Age At EDD: 27.1 yr
OSBR Risk 1 IN: 10000
Test Results:: NEGATIVE
Weight: 180 [lb_av]

## 2020-05-10 ENCOUNTER — Encounter: Payer: Self-pay | Admitting: Family Medicine

## 2020-05-10 ENCOUNTER — Telehealth (INDEPENDENT_AMBULATORY_CARE_PROVIDER_SITE_OTHER): Payer: Medicaid Other | Admitting: Family Medicine

## 2020-05-10 ENCOUNTER — Ambulatory Visit: Payer: Medicaid Other

## 2020-05-10 DIAGNOSIS — Z3482 Encounter for supervision of other normal pregnancy, second trimester: Secondary | ICD-10-CM | POA: Diagnosis not present

## 2020-05-10 DIAGNOSIS — Z3A19 19 weeks gestation of pregnancy: Secondary | ICD-10-CM | POA: Diagnosis not present

## 2020-05-10 DIAGNOSIS — Z3480 Encounter for supervision of other normal pregnancy, unspecified trimester: Secondary | ICD-10-CM

## 2020-05-10 NOTE — Progress Notes (Signed)
° °  TELEHEALTH OBSTETRICS VISIT ENCOUNTER NOTE  I connected with Heidi Rocha on 05/10/20 at  1:20 PM EDT by telephone at home and verified that I am speaking with the correct person using two identifiers.   I discussed the limitations, risks, security and privacy concerns of performing an evaluation and management service by telephone and the availability of in person appointments. I also discussed with the patient that there may be a patient responsible charge related to this service. The patient expressed understanding and agreed to proceed.  Subjective:  Heidi Rocha is a 29 y.o. G2P1001 at [redacted]w[redacted]d being followed for ongoing prenatal care.  She is currently monitored for the following issues for this low-risk pregnancy and has Supervision of other normal pregnancy, antepartum on their problem list.  Patient reports no complaints. Reports fetal movement. Denies any contractions, bleeding or leaking of fluid.   The following portions of the patient's history were reviewed and updated as appropriate: allergies, current medications, past family history, past medical history, past social history, past surgical history and problem list.   Objective:   General:  Alert, oriented and cooperative.   Mental Status: Normal mood and affect perceived. Normal judgment and thought content.  Rest of physical exam deferred due to type of encounter  Assessment and Plan:  Pregnancy: G2P1001 at [redacted]w[redacted]d 1. Supervision of other normal pregnancy, antepartum - Continue routine prenatal care  Preterm labor symptoms and general obstetric precautions including but not limited to vaginal bleeding, contractions, leaking of fluid and fetal movement were reviewed in detail with the patient.  I discussed the assessment and treatment plan with the patient. The patient was provided an opportunity to ask questions and all were answered. The patient agreed with the plan and demonstrated an understanding of the  instructions. The patient was advised to call back or seek an in-person office evaluation/go to MAU at [facility] for any urgent or concerning symptoms. Please refer to After Visit Summary for other counseling recommendations.   I provided 7 minutes of non-face-to-face time during this encounter.  No follow-ups on file.  Future Appointments  Date Time Provider Department Center  05/10/2020  2:45 PM [facility] US4 [facility]    Marlowe Alt, DO Center for [employer], [facility]

## 2020-05-10 NOTE — Progress Notes (Signed)
Patient presents for Mychart ROB. Patient identified with 2 patient identifiers. Patient is not able to check bp at this time due to being at work. Patient has no concerns.

## 2020-05-10 NOTE — Patient Instructions (Signed)

## 2020-05-11 ENCOUNTER — Ambulatory Visit: Payer: Medicaid Other

## 2020-05-24 ENCOUNTER — Ambulatory Visit: Payer: Medicaid Other | Attending: Nurse Practitioner

## 2020-05-24 ENCOUNTER — Other Ambulatory Visit: Payer: Self-pay | Admitting: Nurse Practitioner

## 2020-05-24 ENCOUNTER — Other Ambulatory Visit: Payer: Self-pay

## 2020-05-24 DIAGNOSIS — O99212 Obesity complicating pregnancy, second trimester: Secondary | ICD-10-CM | POA: Diagnosis not present

## 2020-05-24 DIAGNOSIS — Z3480 Encounter for supervision of other normal pregnancy, unspecified trimester: Secondary | ICD-10-CM | POA: Insufficient documentation

## 2020-05-24 DIAGNOSIS — E669 Obesity, unspecified: Secondary | ICD-10-CM | POA: Diagnosis not present

## 2020-05-24 DIAGNOSIS — Z363 Encounter for antenatal screening for malformations: Secondary | ICD-10-CM

## 2020-05-24 DIAGNOSIS — Z3A21 21 weeks gestation of pregnancy: Secondary | ICD-10-CM

## 2020-06-01 DIAGNOSIS — Z3480 Encounter for supervision of other normal pregnancy, unspecified trimester: Secondary | ICD-10-CM

## 2020-06-01 DIAGNOSIS — M549 Dorsalgia, unspecified: Secondary | ICD-10-CM

## 2020-06-04 MED ORDER — MISC. DEVICES MISC
0 refills | Status: DC
Start: 2020-06-04 — End: 2020-09-29

## 2020-06-07 ENCOUNTER — Telehealth (INDEPENDENT_AMBULATORY_CARE_PROVIDER_SITE_OTHER): Payer: Medicaid Other | Admitting: Family Medicine

## 2020-06-07 DIAGNOSIS — Z3A23 23 weeks gestation of pregnancy: Secondary | ICD-10-CM

## 2020-06-07 DIAGNOSIS — Z3482 Encounter for supervision of other normal pregnancy, second trimester: Secondary | ICD-10-CM

## 2020-06-07 DIAGNOSIS — Z3480 Encounter for supervision of other normal pregnancy, unspecified trimester: Secondary | ICD-10-CM

## 2020-06-07 NOTE — Progress Notes (Signed)
S/w pt for virtual visit, pt reports fetal movement, denies pain. Pt states she does not have BP cuff with her but last night it was 117/96

## 2020-06-07 NOTE — Progress Notes (Signed)
   TELEHEALTH OBSTETRICS VISIT ENCOUNTER NOTE  I connected with [name] on 06/07/20 at  2:20 PM EDT by telephone at home and verified that I am speaking with the correct person using two identifiers.   I discussed the limitations, risks, security and privacy concerns of performing an evaluation and management service by telephone and the availability of in person appointments. I also discussed with the patient that there may be a patient responsible charge related to this service. The patient expressed understanding and agreed to proceed.  Subjective:  Heidi Rocha is a 29 y.o. G2P1001 at [redacted]w[redacted]d being followed for ongoing prenatal care.  She is currently monitored for the following issues for this low-risk pregnancy and has Supervision of other normal pregnancy, antepartum on their problem list.  Patient reports no complaints. Occasional low back pain and ordered support belt. Reports fetal movement. Denies any contractions, bleeding or leaking of fluid.   The following portions of the patient's history were reviewed and updated as appropriate: allergies, current medications, past family history, past medical history, past social history, past surgical history and problem list.   Objective:   Vitals:   06/07/20 1341  BP: 127/65  Pulse: 93     General:  Alert, oriented and cooperative.   Mental Status: Normal mood and affect perceived. Normal judgment and thought content.  Rest of physical exam deferred due to type of encounter  Assessment and Plan:  Pregnancy: G2P1001 at [redacted]w[redacted]d 1. Supervision of other normal pregnancy, antepartum - Reviewed initial OB labs and anatomy scan; WNL - In-person visit in 4 weeks for 2nd trimester labs - Girl, both, POPs  Preterm labor symptoms and general obstetric precautions including but not limited to vaginal bleeding, contractions, leaking of fluid and fetal movement were reviewed in detail with the patient.  I discussed the assessment  and treatment plan with the patient. The patient was provided an opportunity to ask questions and all were answered. The patient agreed with the plan and demonstrated an understanding of the instructions. The patient was advised to call back or seek an in-person office evaluation/go to MAU at [facility] for any urgent or concerning symptoms. Please refer to After Visit Summary for other counseling recommendations.   I provided 15 minutes of non-face-to-face time during this encounter.  Return in about 4 weeks (around 07/05/2020) for LOB; in-person.  Future Appointments  Date Time Provider Department Center  06/07/2020  2:20 PM Peniel Biel, Hoyle Sauer, MD [facility] None    Joselyn Arrow, MD Center for [employer], [facility]

## 2020-07-05 ENCOUNTER — Other Ambulatory Visit: Payer: Self-pay

## 2020-07-05 ENCOUNTER — Ambulatory Visit (INDEPENDENT_AMBULATORY_CARE_PROVIDER_SITE_OTHER): Payer: Medicaid Other

## 2020-07-05 VITALS — BP 107/71 | HR 86 | Wt 200.0 lb

## 2020-07-05 DIAGNOSIS — Z3A27 27 weeks gestation of pregnancy: Secondary | ICD-10-CM

## 2020-07-05 DIAGNOSIS — Z3480 Encounter for supervision of other normal pregnancy, unspecified trimester: Secondary | ICD-10-CM

## 2020-07-05 DIAGNOSIS — Z3482 Encounter for supervision of other normal pregnancy, second trimester: Secondary | ICD-10-CM

## 2020-07-05 NOTE — Progress Notes (Signed)
ROB, c/o swelling in feet and ankles.

## 2020-07-05 NOTE — Patient Instructions (Signed)
Glucose Tolerance Test During Pregnancy Why am I having this test? The glucose tolerance test (GTT) is done to check how your body processes sugar (glucose). This is one of several tests used to diagnose diabetes that develops during pregnancy (gestational diabetes mellitus). Gestational diabetes is a temporary form of diabetes that some women develop during pregnancy. It usually occurs during the second trimester of pregnancy and goes away after delivery. Testing (screening) for gestational diabetes usually occurs between 24 and 28 weeks of pregnancy. You may have the GTT test after having a 1-hour glucose screening test if the results from that test indicate that you may have gestational diabetes. You may also have this test if:  You have a history of gestational diabetes.  You have a history of giving birth to very large babies or have experienced repeated fetal loss (stillbirth).  You have signs and symptoms of diabetes, such as: ? Changes in your vision. ? Tingling or numbness in your hands or feet. ? Changes in hunger, thirst, and urination that are not otherwise explained by your pregnancy. What is being tested? This test measures the amount of glucose in your blood at different times during a period of 3 hours. This indicates how well your body is able to process glucose. What kind of sample is taken?  Blood samples are required for this test. They are usually collected by inserting a needle into a blood vessel. How do I prepare for this test?  For 3 days before your test, eat normally. Have plenty of carbohydrate-rich foods.  Follow instructions from your health care provider about: ? Eating or drinking restrictions on the day of the test. You may be asked to not eat or drink anything other than water (fast) starting 8-10 hours before the test. ? Changing or stopping your regular medicines. Some medicines may interfere with this test. Tell a health care provider about:  All  medicines you are taking, including vitamins, herbs, eye drops, creams, and over-the-counter medicines.  Any blood disorders you have.  Any surgeries you have had.  Any medical conditions you have. What happens during the test? First, your blood glucose will be measured. This is referred to as your fasting blood glucose, since you fasted before the test. Then, you will drink a glucose solution that contains a certain amount of glucose. Your blood glucose will be measured again 1, 2, and 3 hours after drinking the solution. This test takes about 3 hours to complete. You will need to stay at the testing location during this time. During the testing period:  Do not eat or drink anything other than the glucose solution.  Do not exercise.  Do not use any products that contain nicotine or tobacco, such as cigarettes and e-cigarettes. If you need help stopping, ask your health care provider. The testing procedure may vary among health care providers and hospitals. How are the results reported? Your results will be reported as milligrams of glucose per deciliter of blood (mg/dL) or millimoles per liter (mmol/L). Your health care provider will compare your results to normal ranges that were established after testing a large group of people (reference ranges). Reference ranges may vary among labs and hospitals. For this test, common reference ranges are:  Fasting: less than 95-105 mg/dL (5.3-5.8 mmol/L).  1 hour after drinking glucose: less than 180-190 mg/dL (10.0-10.5 mmol/L).  2 hours after drinking glucose: less than 155-165 mg/dL (8.6-9.2 mmol/L).  3 hours after drinking glucose: 140-145 mg/dL (7.8-8.1 mmol/L). What do the   results mean? Results within reference ranges are considered normal, meaning that your glucose levels are well-controlled. If two or more of your blood glucose levels are high, you may be diagnosed with gestational diabetes. If only one level is high, your health care  provider may suggest repeat testing or other tests to confirm a diagnosis. Talk with your health care provider about what your results mean. Questions to ask your health care provider Ask your health care provider, or the department that is doing the test:  When will my results be ready?  How will I get my results?  What are my treatment options?  What other tests do I need?  What are my next steps? Summary  The glucose tolerance test (GTT) is one of several tests used to diagnose diabetes that develops during pregnancy (gestational diabetes mellitus). Gestational diabetes is a temporary form of diabetes that some women develop during pregnancy.  You may have the GTT test after having a 1-hour glucose screening test if the results from that test indicate that you may have gestational diabetes. You may also have this test if you have any symptoms or risk factors for gestational diabetes.  Talk with your health care provider about what your results mean. This information is not intended to replace advice given to you by your health care provider. Make sure you discuss any questions you have with your health care provider. Document Revised: 03/17/2019 Document Reviewed: 07/06/2017 Elsevier Patient Education  2020 [employer]  

## 2020-07-05 NOTE — Progress Notes (Signed)
   LOW-RISK PREGNANCY OFFICE VISIT  Patient name: Heidi Rocha MRN [id number]  Date of birth: 1991-09-01 Chief Complaint:   Routine Prenatal Visit  Subjective:   Heidi Rocha is a 29 y.o. G2P1001 female at [redacted]w[redacted]d with an Estimated Date of Delivery: 10/02/20 being seen today for ongoing management of a low-risk pregnancy aeb has Supervision of other normal pregnancy, antepartum on their problem list.  Patient presents today with complaint of feet and ankle swelling.  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  Patient denies vaginal concerns including abnormal discharge, leaking of fluid, and bleeding.  Contractions: Not present. Vag. Bleeding: None.  Movement: Present.  Reviewed past medical,surgical, social, obstetrical and family history as well as problem list, medications and allergies.  Objective   Vitals:   07/05/20 1508  BP: 107/71  Pulse: 86  Weight: 200 lb (90.7 kg)  Body mass index is 36.58 kg/m.  Total Weight Gain:20 lb (9.072 kg)         Physical Examination:   General appearance: Well appearing, and in no distress  Mental status: Alert, oriented to person, place, and time  Skin: Warm & dry  Cardiovascular: Normal heart rate noted  Respiratory: Normal respiratory effort, no distress  Abdomen: Soft, gravid, nontender, LGA with Fundal height of Fundal Height: 30 cm  Pelvic: Cervical exam deferred           Extremities: Edema: Trace  Fetal Status: Fetal Heart Rate (bpm): 154  Movement: Present   No results found for this or any previous visit (from the past 24 hour(s)).  Assessment & Plan:  Low-risk pregnancy of a 29 y.o., G2P1001 at [redacted]w[redacted]d with an Estimated Date of Delivery: 10/02/20   1. Supervision of other normal pregnancy, antepartum -Patient in need of GTT within next week.  -Reviewed Glucola appt preparation including fasting the night before and morning of.     -Discussed anticipated office time of 2.5-3 hours.  -Reviewed blood draw procedures and labs which also include check of iron level.  -Discussed how results of GTT are handled including diabetic education and BS testing for abnormal results and routine care for normal results. -Discussed increased intake of water, elevation, and compression stockings for edema. -Encouraged to rest when possible. -Plan next visit virtually and hopefully patient can rest between jobs!    Meds: No orders of the defined types were placed in this encounter.  Labs/procedures today:  Lab Orders  No laboratory test(s) ordered today     Reviewed: Preterm labor symptoms and general obstetric precautions including but not limited to vaginal bleeding, contractions, leaking of fluid and fetal movement were reviewed in detail with the patient.  All questions were answered.  Follow-up: Return in about 4 weeks (around 08/02/2020), or Schedule LAB VISIT ONLY for Next week for GTT, for Virtual LR-ROB.  No orders of the defined types were placed in this encounter.  Cherre Robins MSN, CNM 07/05/2020

## 2020-07-10 ENCOUNTER — Other Ambulatory Visit: Payer: Self-pay

## 2020-07-10 ENCOUNTER — Other Ambulatory Visit: Payer: Medicaid Other

## 2020-07-10 DIAGNOSIS — Z3480 Encounter for supervision of other normal pregnancy, unspecified trimester: Secondary | ICD-10-CM

## 2020-07-10 NOTE — Progress Notes (Unsigned)
Lab needs order for lab only visit pt

## 2020-07-11 LAB — CBC
Hematocrit: 29.5 % — ABNORMAL LOW (ref 34.0–46.6)
Hemoglobin: 9.3 g/dL — ABNORMAL LOW (ref 11.1–15.9)
MCH: 26.5 pg — ABNORMAL LOW (ref 26.6–33.0)
MCHC: 31.5 g/dL (ref 31.5–35.7)
MCV: 84 fL (ref 79–97)
Platelets: 254 10*3/uL (ref 150–450)
RBC: 3.51 x10E6/uL — ABNORMAL LOW (ref 3.77–5.28)
RDW: 13.7 % (ref 11.7–15.4)
WBC: 10.3 10*3/uL (ref 3.4–10.8)

## 2020-07-11 LAB — GLUCOSE TOLERANCE, 2 HOURS W/ 1HR
Glucose, 1 hour: 92 mg/dL (ref 65–179)
Glucose, 2 hour: 86 mg/dL (ref 65–152)
Glucose, Fasting: 79 mg/dL (ref 65–91)

## 2020-07-11 LAB — HIV ANTIBODY (ROUTINE TESTING W REFLEX): HIV Screen 4th Generation wRfx: NONREACTIVE

## 2020-07-11 LAB — RPR: RPR Ser Ql: NONREACTIVE

## 2020-07-13 ENCOUNTER — Other Ambulatory Visit: Payer: Self-pay | Admitting: Medical

## 2020-07-13 ENCOUNTER — Other Ambulatory Visit: Payer: Self-pay | Admitting: Obstetrics and Gynecology

## 2020-07-13 DIAGNOSIS — B359 Dermatophytosis, unspecified: Secondary | ICD-10-CM

## 2020-07-13 MED ORDER — CICLOPIROX OLAMINE 0.77 % EX CREA: TOPICAL_CREAM | Freq: Two times a day (BID) | CUTANEOUS | 0 refills | Status: DC

## 2020-07-13 MED ORDER — FERROUS SULFATE 325 (65 FE) MG PO TBEC: 325.0000 mg | DELAYED_RELEASE_TABLET | Freq: Every day | ORAL | 0 refills | Status: DC

## 2020-07-19 ENCOUNTER — Telehealth: Payer: Self-pay

## 2020-07-19 NOTE — Telephone Encounter (Signed)
Returned call and answered questions about iron rx

## 2020-08-02 ENCOUNTER — Encounter: Payer: Self-pay | Admitting: Women's Health

## 2020-08-02 ENCOUNTER — Other Ambulatory Visit: Payer: Self-pay

## 2020-08-02 ENCOUNTER — Ambulatory Visit (INDEPENDENT_AMBULATORY_CARE_PROVIDER_SITE_OTHER): Payer: Medicaid Other | Admitting: Women's Health

## 2020-08-02 VITALS — BP 113/72 | HR 92 | Wt 203.0 lb

## 2020-08-02 DIAGNOSIS — Z3480 Encounter for supervision of other normal pregnancy, unspecified trimester: Secondary | ICD-10-CM

## 2020-08-02 NOTE — Patient Instructions (Signed)
Maternity Assessment Unit (MAU)  The Maternity Assessment Unit (MAU) is located at the [facility] at [facility]. The address is: [address] [place], [place], [place] [address]. Please see map below for additional directions.    The Maternity Assessment Unit is designed to help you during your pregnancy, and for up to 6 weeks after delivery, with any pregnancy- or postpartum-related emergencies, if you think you are in labor, or if your water has broken. For example, if you experience nausea and vomiting, vaginal bleeding, severe abdominal or pelvic pain, elevated blood pressure or other problems related to your pregnancy or postpartum time, please come to the Maternity Assessment Unit for assistance.        Third Trimester of Pregnancy The third trimester is from week 28 through week 40 (months 7 through 9). The third trimester is a time when the unborn baby (fetus) is growing rapidly. At the end of the ninth month, the fetus is about 20 inches in length and weighs 6-10 pounds. Body changes during your third trimester Your body will continue to go through many changes during pregnancy. The changes vary from woman to woman. During the third trimester:  Your weight will continue to increase. You can expect to gain 25-35 pounds (11-16 kg) by the end of the pregnancy.  You may begin to get stretch marks on your hips, abdomen, and breasts.  You may urinate more often because the fetus is moving lower into your pelvis and pressing on your bladder.  You may develop or continue to have heartburn. This is caused by increased hormones that slow down muscles in the digestive tract.  You may develop or continue to have constipation because increased hormones slow digestion and cause the muscles that push waste through your intestines to relax.  You may develop hemorrhoids. These are swollen veins (varicose veins) in the rectum that can itch or be  painful.  You may develop swollen, bulging veins (varicose veins) in your legs.  You may have increased body aches in the pelvis, back, or thighs. This is due to weight gain and increased hormones that are relaxing your joints.  You may have changes in your hair. These can include thickening of your hair, rapid growth, and changes in texture. Some women also have hair loss during or after pregnancy, or hair that feels dry or thin. Your hair will most likely return to normal after your baby is born.  Your breasts will continue to grow and they will continue to become tender. A yellow fluid (colostrum) may leak from your breasts. This is the first milk you are producing for your baby.  Your belly button may stick out.  You may notice more swelling in your hands, face, or ankles.  You may have increased tingling or numbness in your hands, arms, and legs. The skin on your belly may also feel numb.  You may feel short of breath because of your expanding uterus.  You may have more problems sleeping. This can be caused by the size of your belly, increased need to urinate, and an increase in your body's metabolism.  You may notice the fetus "dropping," or moving lower in your abdomen (lightening).  You may have increased vaginal discharge.  You may notice your joints feel loose and you may have pain around your pelvic bone. What to expect at prenatal visits You will have prenatal exams every 2 weeks until week 36. Then you will have weekly prenatal exams.  During a routine prenatal visit:  You will be weighed to make sure you and the baby are growing normally.  Your blood pressure will be taken.  Your abdomen will be measured to track your baby's growth.  The fetal heartbeat will be listened to.  Any test results from the previous visit will be discussed.  You may have a cervical check near your due date to see if your cervix has softened or thinned (effaced).  You will be tested for  Group B streptococcus. This happens between 35 and 37 weeks. Your health care provider may ask you:  What your birth plan is.  How you are feeling.  If you are feeling the baby move.  If you have had any abnormal symptoms, such as leaking fluid, bleeding, severe headaches, or abdominal cramping.  If you are using any tobacco products, including cigarettes, chewing tobacco, and electronic cigarettes.  If you have any questions. Other tests or screenings that may be performed during your third trimester include:  Blood tests that check for low iron levels (anemia).  Fetal testing to check the health, activity level, and growth of the fetus. Testing is done if you have certain medical conditions or if there are problems during the pregnancy.  Nonstress test (NST). This test checks the health of your baby to make sure there are no signs of problems, such as the baby not getting enough oxygen. During this test, a belt is placed around your belly. The baby is made to move, and its heart rate is monitored during movement. What is false labor? False labor is a condition in which you feel small, irregular tightenings of the muscles in the womb (contractions) that usually go away with rest, changing position, or drinking water. These are called Braxton Hicks contractions. Contractions may last for hours, days, or even weeks before true labor sets in. If contractions come at regular intervals, become more frequent, increase in intensity, or become painful, you should see your health care provider. What are the signs of labor?  Abdominal cramps.  Regular contractions that start at 10 minutes apart and become stronger and more frequent with time.  Contractions that start on the top of the uterus and spread down to the lower abdomen and back.  Increased pelvic pressure and dull back pain.  A watery or bloody mucus discharge that comes from the vagina.  Leaking of amniotic fluid. This is also  known as your "water breaking." It could be a slow trickle or a gush. Let your health care provider know if it has a color or strange odor. If you have any of these signs, call your health care provider right away, even if it is before your due date. Follow these instructions at home: Medicines  Follow your health care provider's instructions regarding medicine use. Specific medicines may be either safe or unsafe to take during pregnancy.  Take a prenatal vitamin that contains at least 600 micrograms (mcg) of folic acid.  If you develop constipation, try taking a stool softener if your health care provider approves. Eating and drinking   Eat a balanced diet that includes fresh fruits and vegetables, whole grains, good sources of protein such as meat, eggs, or tofu, and low-fat dairy. Your health care provider will help you determine the amount of weight gain that is right for you.  Avoid raw meat and uncooked cheese. These carry germs that can cause birth defects in the baby.  If you have low calcium intake from  food, talk to your health care provider about whether you should take a daily calcium supplement.  Eat four or five small meals rather than three large meals a day.  Limit foods that are high in fat and processed sugars, such as fried and sweet foods.  To prevent constipation: ? Drink enough fluid to keep your urine clear or pale yellow. ? Eat foods that are high in fiber, such as fresh fruits and vegetables, whole grains, and beans. Activity  Exercise only as directed by your health care provider. Most women can continue their usual exercise routine during pregnancy. Try to exercise for 30 minutes at least 5 days a week. Stop exercising if you experience uterine contractions.  Avoid heavy lifting.  Do not exercise in extreme heat or humidity, or at high altitudes.  Wear low-heel, comfortable shoes.  Practice good posture.  You may continue to have sex unless your health  care provider tells you otherwise. Relieving pain and discomfort  Take frequent breaks and rest with your legs elevated if you have leg cramps or low back pain.  Take warm sitz baths to soothe any pain or discomfort caused by hemorrhoids. Use hemorrhoid cream if your health care provider approves.  Wear a good support bra to prevent discomfort from breast tenderness.  If you develop varicose veins: ? Wear support pantyhose or compression stockings as told by your healthcare provider. ? Elevate your feet for 15 minutes, 3-4 times a day. Prenatal care  Write down your questions. Take them to your prenatal visits.  Keep all your prenatal visits as told by your health care provider. This is important. Safety  Wear your seat belt at all times when driving.  Make a list of emergency phone numbers, including numbers for family, friends, the hospital, and police and fire departments. General instructions  Avoid cat litter boxes and soil used by cats. These carry germs that can cause birth defects in the baby. If you have a cat, ask someone to clean the litter box for you.  Do not travel far distances unless it is absolutely necessary and only with the approval of your health care provider.  Do not use hot tubs, steam rooms, or saunas.  Do not drink alcohol.  Do not use any products that contain nicotine or tobacco, such as cigarettes and e-cigarettes. If you need help quitting, ask your health care provider.  Do not use any medicinal herbs or unprescribed drugs. These chemicals affect the formation and growth of the baby.  Do not douche or use tampons or scented sanitary pads.  Do not cross your legs for long periods of time.  To prepare for the arrival of your baby: ? Take prenatal classes to understand, practice, and ask questions about labor and delivery. ? Make a trial run to the hospital. ? Visit the hospital and tour the maternity area. ? Arrange for maternity or paternity  leave through employers. ? Arrange for family and friends to take care of pets while you are in the hospital. ? Purchase a rear-facing car seat and make sure you know how to install it in your car. ? Pack your hospital bag. ? Prepare the babys nursery. Make sure to remove all pillows and stuffed animals from the baby's crib to prevent suffocation.  Visit your dentist if you have not gone during your pregnancy. Use a soft toothbrush to brush your teeth and be gentle when you floss. Contact a health care provider if:  You are unsure if  you are in labor or if your water has broken.  You become dizzy.  You have mild pelvic cramps, pelvic pressure, or nagging pain in your abdominal area.  You have lower back pain.  You have persistent nausea, vomiting, or diarrhea.  You have an unusual or bad smelling vaginal discharge.  You have pain when you urinate. Get help right away if:  Your water breaks before 37 weeks.  You have regular contractions less than 5 minutes apart before 37 weeks.  You have a fever.  You are leaking fluid from your vagina.  You have spotting or bleeding from your vagina.  You have severe abdominal pain or cramping.  You have rapid weight loss or weight gain.  You have shortness of breath with chest pain.  You notice sudden or extreme swelling of your face, hands, ankles, feet, or legs.  Your baby makes fewer than 10 movements in 2 hours.  You have severe headaches that do not go away when you take medicine.  You have vision changes. Summary  The third trimester is from week 28 through week 40, months 7 through 9. The third trimester is a time when the unborn baby (fetus) is growing rapidly.  During the third trimester, your discomfort may increase as you and your baby continue to gain weight. You may have abdominal, leg, and back pain, sleeping problems, and an increased need to urinate.  During the third trimester your breasts will keep growing  and they will continue to become tender. A yellow fluid (colostrum) may leak from your breasts. This is the first milk you are producing for your baby.  False labor is a condition in which you feel small, irregular tightenings of the muscles in the womb (contractions) that eventually go away. These are called Braxton Hicks contractions. Contractions may last for hours, days, or even weeks before true labor sets in.  Signs of labor can include: abdominal cramps; regular contractions that start at 10 minutes apart and become stronger and more frequent with time; watery or bloody mucus discharge that comes from the vagina; increased pelvic pressure and dull back pain; and leaking of amniotic fluid. This information is not intended to replace advice given to you by your health care provider. Make sure you discuss any questions you have with your health care provider. Document Revised: 03/17/2019 Document Reviewed: 12/30/2016 Elsevier Patient Education  2020 [employer]        Preterm Labor and Birth Information  The normal length of a pregnancy is 39-41 weeks. Preterm labor is when labor starts before 37 completed weeks of pregnancy. What are the risk factors for preterm labor? Preterm labor is more likely to occur in women who:  Have certain infections during pregnancy such as a bladder infection, sexually transmitted infection, or infection inside the uterus (chorioamnionitis).  Have a shorter-than-normal cervix.  Have gone into preterm labor before.  Have had surgery on their cervix.  Are younger than age 10317 or older than age 27.  Are African American.  Are pregnant with twins or multiple babies (multiple gestation).  Take street drugs or smoke while pregnant.  Do not gain enough weight while pregnant.  Became pregnant shortly after having been pregnant. What are the symptoms of preterm labor? Symptoms of preterm labor include:  Cramps similar to those that can happen  during a menstrual period. The cramps may happen with diarrhea.  Pain in the abdomen or lower back.  Regular uterine contractions that may feel like tightening  of the abdomen.  A feeling of increased pressure in the pelvis.  Increased watery or bloody mucus discharge from the vagina.  Water breaking (ruptured amniotic sac). Why is it important to recognize signs of preterm labor? It is important to recognize signs of preterm labor because babies who are born prematurely may not be fully developed. This can put them at an increased risk for:  Long-term (chronic) heart and lung problems.  Difficulty immediately after birth with regulating body systems, including blood sugar, body temperature, heart rate, and breathing rate.  Bleeding in the brain.  Cerebral palsy.  Learning difficulties.  Death. These risks are highest for babies who are born before 34 weeks of pregnancy. How is preterm labor treated? Treatment depends on the length of your pregnancy, your condition, and the health of your baby. It may involve:  Having a stitch (suture) placed in your cervix to prevent your cervix from opening too early (cerclage).  Taking or being given medicines, such as: ? Hormone medicines. These may be given early in pregnancy to help support the pregnancy. ? Medicine to stop contractions. ? Medicines to help mature the babys lungs. These may be prescribed if the risk of delivery is high. ? Medicines to prevent your baby from developing cerebral palsy. If the labor happens before 34 weeks of pregnancy, you may need to stay in the hospital. What should I do if I think I am in preterm labor? If you think that you are going into preterm labor, call your health care provider right away. How can I prevent preterm labor in future pregnancies? To increase your chance of having a full-term pregnancy:  Do not use any tobacco products, such as cigarettes, chewing tobacco, and e-cigarettes. If you  need help quitting, ask your health care provider.  Do not use street drugs or medicines that have not been prescribed to you during your pregnancy.  Talk with your health care provider before taking any herbal supplements, even if you have been taking them regularly.  Make sure you gain a healthy amount of weight during your pregnancy.  Watch for infection. If you think that you might have an infection, get it checked right away.  Make sure to tell your health care provider if you have gone into preterm labor before. This information is not intended to replace advice given to you by your health care provider. Make sure you discuss any questions you have with your health care provider. Document Revised: 03/18/2019 Document Reviewed: 04/16/2016 Elsevier Patient Education  2020 [employer].       https://www.cdc.gov/vaccines/hcp/vis/vis-statements/tdap.pdf">  Tdap (Tetanus, Diphtheria, Pertussis) Vaccine: What You Need to Know 1. Why get vaccinated? Tdap vaccine can prevent tetanus, diphtheria, and pertussis. Diphtheria and pertussis spread from person to person. Tetanus enters the body through cuts or wounds.  TETANUS (T) causes painful stiffening of the muscles. Tetanus can lead to serious health problems, including being unable to open the mouth, having trouble swallowing and breathing, or death.  DIPHTHERIA (D) can lead to difficulty breathing, heart failure, paralysis, or death.  PERTUSSIS (aP), also known as "whooping cough," can cause uncontrollable, violent coughing which makes it hard to breathe, eat, or drink. Pertussis can be extremely serious in babies and young children, causing pneumonia, convulsions, brain damage, or death. In teens and adults, it can cause weight loss, loss of bladder control, passing out, and rib fractures from severe coughing. 2. Tdap vaccine Tdap is only for children 7 years and older, adolescents, and adults.  Adolescents should receive a single  dose of Tdap, preferably at age 28 or 12 years. Pregnant women should get a dose of Tdap during every pregnancy, to protect the newborn from pertussis. Infants are most at risk for severe, life-threatening complications from pertussis. Adults who have never received Tdap should get a dose of Tdap. Also, adults should receive a booster dose every 10 years, or earlier in the case of a severe and dirty wound or burn. Booster doses can be either Tdap or Td (a different vaccine that protects against tetanus and diphtheria but not pertussis). Tdap may be given at the same time as other vaccines. 3. Talk with your health care provider Tell your vaccine provider if the person getting the vaccine:  Has had an allergic reaction after a previous dose of any vaccine that protects against tetanus, diphtheria, or pertussis, or has any severe, life-threatening allergies.  Has had a coma, decreased level of consciousness, or prolonged seizures within 7 days after a previous dose of any pertussis vaccine (DTP, DTaP, or Tdap).  Has seizures or another nervous system problem.  Has ever had Guillain-Barr Syndrome (also called GBS).  Has had severe pain or swelling after a previous dose of any vaccine that protects against tetanus or diphtheria. In some cases, your health care provider may decide to postpone Tdap vaccination to a future visit.  People with minor illnesses, such as a cold, may be vaccinated. People who are moderately or severely ill should usually wait until they recover before getting Tdap vaccine.  Your health care provider can give you more information. 4. Risks of a vaccine reaction  Pain, redness, or swelling where the shot was given, mild fever, headache, feeling tired, and nausea, vomiting, diarrhea, or stomachache sometimes happen after Tdap vaccine. People sometimes faint after medical procedures, including vaccination. Tell your provider if you feel dizzy or have vision changes or  ringing in the ears.  As with any medicine, there is a very remote chance of a vaccine causing a severe allergic reaction, other serious injury, or death. 5. What if there is a serious problem? An allergic reaction could occur after the vaccinated person leaves the clinic. If you see signs of a severe allergic reaction (hives, swelling of the face and throat, difficulty breathing, a fast heartbeat, dizziness, or weakness), call 9-1-1 and get the person to the nearest hospital. For other signs that concern you, call your health care provider.  Adverse reactions should be reported to the Vaccine Adverse Event Reporting System (VAERS). Your health care provider will usually file this report, or you can do it yourself. Visit the VAERS website at www.vaers.LAgents.no or call 239-367-5721. VAERS is only for reporting reactions, and VAERS staff do not give medical advice. 6. The National Vaccine Injury Compensation Program The [employer] Vaccine Injury Compensation Program (VICP) is a federal program that was created to compensate people who may have been injured by certain vaccines. Visit the VICP website at SpiritualWord.at or call 806-225-1713 to learn about the program and about filing a claim. There is a time limit to file a claim for compensation. 7. How can I learn more?  Ask your health care provider.  Call your local or state health department.  Contact the Centers for Disease Control and Prevention (CDC): ? Call (443) 569-5903 (1-800-CDC-INFO) or ? Visit CDC's website at PicCapture.uy Vaccine Information Statement Tdap (Tetanus, Diphtheria, Pertussis) Vaccine (03/09/2019) This information is not intended to replace advice given to you by your health care provider. Make  sure you discuss any questions you have with your health care provider. Document Revised: 03/18/2019 Document Reviewed: 03/21/2019 Elsevier Patient Education  2020 [employer].       Childbirth  Education Options: [facility] Department Classes:  Childbirth education classes can help you get ready for a positive parenting experience. You can also meet other expectant parents and get free stuff for your baby. Each class runs for five weeks on the same night and costs $45 for the mother-to-be and her support person. Medicaid covers the cost if you are eligible. Call 220-173-8775 to register. [facility] Childbirth Education:  508-339-1722 or (330) 583-7829 or sophia.law@[employer] .com  Baby & Me Class: Discuss newborn & infant parenting and family adjustment issues with other new mothers in a relaxed environment. Each week brings a new speaker or baby-centered activity. We encourage new mothers to join [place] every Thursday at 11:00am. Babies birth until crawling. No registration or fee. Daddy [employer]: This course offers Dads-to-be the tools and knowledge needed to feel confident on their journey to becoming new fathers. Experienced dads, who have been trained as coaches, teach dads-to-be how to hold, comfort, diaper, swaddle and play with their infant while being able to support the new mom as well. A class for men taught by men. $25/dad Big Brother/Big Sister: Let your children share in the joy of a new brother or sister in this special class designed just for them. Class includes discussion about how families care for babies: swaddling, holding, diapering, safety as well as how they can be helpful in their new role. This class is designed for children ages 2 to 21, but any age is welcome. Please register each child individually. $5/child  Mom Talk: This mom-led group offers support and connection to mothers as they journey through the adjustments and struggles of that sometimes overwhelming first year after the birth of a child. Tuesdays at 10:00am and Thursdays at 6:00pm. Babies welcome. No registration or fee. Breastfeeding Support Group: This group is a mother-to-mother support  circle where moms have the opportunity to share their breastfeeding experiences. A Lactation Consultant is present for questions and concerns. Meets each Tuesday at 11:00am. No fee or registration. Breastfeeding Your Baby: Learn what to expect in the first days of breastfeeding your newborn.  This class will help you feel more confident with the skills needed to begin your breastfeeding experience. Many new mothers are concerned about breastfeeding after leaving the hospital. This class will also address the most common fears and challenges about breastfeeding during the first few weeks, months and beyond. (call for fee) Comfort Techniques and Tour: This 2 hour interactive class will provide you the opportunity to learn & practice hands-on techniques that can help relieve some of the discomfort of labor and encourage your baby to rotate toward the best position for birth. You and your partner will be able to try a variety of labor positions with birth balls and rebozos as well as practice breathing, relaxation, and visualization techniques. A tour of the [facility] is included with this class. $20 per registrant and support person Childbirth Class- Weekend Option: This class is a Weekend version of our Birth & Baby series. It is designed for parents who have a difficult time fitting several weeks of classes into their schedule. It covers the care of your newborn and the basics of labor and childbirth. It also includes a Maternity Care Center Tour of [facility] and lunch. The class is held two consecutive  days: beginning on Friday evening from 6:30 - 8:30 p.m. and the next day, Saturday from 9 a.m. - 4 p.m. (call for fee) Linden Dolin Class: Interested in a waterbirth?  This informational class will help you discover whether waterbirth is the right fit for you. Education about waterbirth itself, supplies you would need and how to assemble your support team is what you can expect  from this class. Some obstetrical practices require this class in order to pursue a waterbirth. (Not all obstetrical practices offer waterbirth-check with your healthcare provider.) Register only the expectant mom, but you are encouraged to bring your partner to class! Required if planning waterbirth, no fee. Infant/Child CPR: Parents, grandparents, babysitters, and friends learn Cardio-Pulmonary Resuscitation skills for infants and children. You will also learn how to treat both conscious and unconscious choking in infants and children. This Family & Friends program does not offer certification. Register each participant individually to ensure that enough mannequins are available. (Call for fee) Grandparent Love: Expecting a grandbaby? This class is for you! Learn about the latest infant care and safety recommendations and ways to support your own child as he or she transitions into the parenting role. Taught by Registered Nurses who are childbirth instructors, but most importantly...they are grandmothers too! $10/person. Childbirth Class- Natural Childbirth: This series of 5 weekly classes is for expectant parents who want to learn and practice natural methods of coping with the process of labor and childbirth. Relaxation, breathing, massage, visualization, role of the partner, and helpful positioning are highlighted. Participants learn how to be confident in their body's ability to give birth. This class will empower and help parents make informed decisions about their own care. Includes discussion that will help new parents transition into the immediate postpartum period. Maternity Care Center Tour of [facility] is included. We suggest taking this class between 25-32 weeks, but it's only a recommendation. $75 per registrant and one support person or $30 Medicaid. Childbirth Class- 3 week Series: This option of 3 weekly classes helps you and your labor partner prepare for childbirth. Newborn care, labor  & birth, cesarean birth, pain management, and comfort techniques are discussed and a Maternity Care Center Tour of [facility] is included. The class meets at the same time, on the same day of the week for 3 consecutive weeks beginning with the starting date you choose. $60 for registrant and one support person.  Marvelous Multiples: Expecting twins, triplets, or more? This class covers the differences in labor, birth, parenting, and breastfeeding issues that face multiples parents. NICU tour is included. Led by a Certified Childbirth Educator who is the mother of twins. No fee. Caring for Baby: This class is for expectant and adoptive parents who want to learn and practice the most up-to-date newborn care for their babies. Focus is on birth through the first six weeks of life. Topics include feeding, bathing, diapering, crying, umbilical cord care, circumcision care and safe sleep. Parents learn to recognize symptoms of illness and when to call the pediatrician. Register only the mom-to-be and your partner or support person can plan to come with you! $10 per registrant and support person Childbirth Class- online option: This online class offers you the freedom to complete a Birth and Baby series in the comfort of your own home. The flexibility of this option allows you to review sections at your own pace, at times convenient to you and your support people. It includes additional video information, animations, quizzes, and extended activities. Get organized with helpful  eClass tools, checklists, and trackers. Once you register online for the class, you will receive an email within a few days to accept the invitation and begin the class when the time is right for you. The content will be available to you for 60 days. $60 for 60 days of online access for you and your support people.

## 2020-08-02 NOTE — Progress Notes (Signed)
Subjective:  Heidi Rocha is a 29 y.o. G2P1001 at [redacted]w[redacted]d being seen today for ongoing prenatal care.  She is currently monitored for the following issues for this low-risk pregnancy and has Supervision of other normal pregnancy, antepartum on their problem list.  Patient reports no complaints.  Contractions: Not present. Vag. Bleeding: None.  Movement: Present. Denies leaking of fluid.   The following portions of the patient's history were reviewed and updated as appropriate: allergies, current medications, past family history, past medical history, past social history, past surgical history and problem list. Problem list updated.  Objective:   Vitals:   08/02/20 1505  BP: 113/72  Pulse: 92  Weight: 203 lb (92.1 kg)    Fetal Status: Fetal Heart Rate (bpm): 147 Fundal Height: 31 cm Movement: Present     General:  Alert, oriented and cooperative. Patient is in no acute distress.  Skin: Skin is warm and dry. No rash noted.   Cardiovascular: Normal heart rate noted  Respiratory: Normal respiratory effort, no problems with respiration noted  Abdomen: Soft, gravid, appropriate for gestational age. Pain/Pressure: Absent     Pelvic: Vag. Bleeding: None     Cervical exam deferred        Extremities: Normal range of motion.  Edema: Trace  Mental Status: Normal mood and affect. Normal behavior. Normal judgment and thought content.   Urinalysis:      Assessment and Plan:  Pregnancy: G2P1001 at [redacted]w[redacted]d  1. Supervision of other normal pregnancy, antepartum -discussed Tdap today, info given, pt declines but will consider at a later time -CBE information given -Hepatitis C today  Preterm labor symptoms and general obstetric precautions including but not limited to vaginal bleeding, contractions, leaking of fluid and fetal movement were reviewed in detail with the patient. I discussed the assessment and treatment plan with the patient. The patient was provided an opportunity to ask questions  and all were answered. The patient agreed with the plan and demonstrated an understanding of the instructions. The patient was advised to call back or seek an in-person office evaluation/go to MAU at [facility] for any urgent or concerning symptoms. Please refer to After Visit Summary for other counseling recommendations.  Return in about 2 weeks (around 08/16/2020) for virtual LOB/APP OK.   Tyrianna Lightle, Odie Sera, NP

## 2020-08-02 NOTE — Progress Notes (Signed)
ROB   CC: None    

## 2020-08-03 LAB — HEPATITIS C ANTIBODY: Hep C Virus Ab: <0.1 s/co ratio (ref 0.0–0.9)

## 2020-08-16 ENCOUNTER — Telehealth (INDEPENDENT_AMBULATORY_CARE_PROVIDER_SITE_OTHER): Payer: Medicaid Other

## 2020-08-16 VITALS — BP 119/62 | HR 100

## 2020-08-16 DIAGNOSIS — O99891 Other specified diseases and conditions complicating pregnancy: Secondary | ICD-10-CM

## 2020-08-16 DIAGNOSIS — M62838 Other muscle spasm: Secondary | ICD-10-CM

## 2020-08-16 DIAGNOSIS — Z3A33 33 weeks gestation of pregnancy: Secondary | ICD-10-CM

## 2020-08-16 DIAGNOSIS — Z3480 Encounter for supervision of other normal pregnancy, unspecified trimester: Secondary | ICD-10-CM

## 2020-08-16 NOTE — Patient Instructions (Signed)

## 2020-08-16 NOTE — Progress Notes (Signed)
I connected with Heidi Rocha on 08/16/20 at  3:40 PM EDT by telephone and verified that I am speaking with the correct person using two identifiers.  Virtual ROB visit  No complaints per pt

## 2020-08-16 NOTE — Progress Notes (Signed)
   OBSTETRICS PRENATAL VIRTUAL VISIT ENCOUNTER NOTE  Provider location: [facility] for [facility]   I connected with Heidi Rocha on 08/16/20 at  3:40 PM EDT by MyChart Video Encounter at home and verified that I am speaking with the correct person using two identifiers.   I discussed the limitations, risks, security and privacy concerns of performing an evaluation and management service virtually and the availability of in person appointments. I also discussed with the patient that there may be a patient responsible charge related to this service. The patient expressed understanding and agreed to proceed. Subjective:  Heidi Rocha is a 29 y.o. G2P1001 at [redacted]w[redacted]d being seen today for ongoing prenatal care.  She is currently monitored for the following issues for this low-risk pregnancy and has Supervision of other normal pregnancy, antepartum on their problem list.  Patient reports sharp pain in her legs. She states the pain occurs in the morning upon waking and lasts ~5 minutes. She states it resolves spontaneously and occurs daily but only with waking up. Patient endorses fetal movement and denies abdominal cramping or contractions.  She states she is having pressure.  Patient denies vaginal concerns.   Contractions: Not present. Vag. Bleeding: None.  Movement: Present. Denies any leaking of fluid.   The following portions of the patient's history were reviewed and updated as appropriate: allergies, current medications, past family history, past medical history, past social history, past surgical history and problem list.   Objective:   Vitals:   08/16/20 1441  BP: 119/62  Pulse: 100    Fetal Status:     Movement: Present     General:  Alert, oriented and cooperative. Patient is in no acute distress.  Respiratory: Normal respiratory effort, no problems with respiration noted  Mental Status: Normal mood and affect. Normal behavior. Normal judgment and thought  content.  Rest of physical exam deferred due to type of encounter  Imaging: No results found.  Assessment and Plan:  Pregnancy: G2P1001 at [redacted]w[redacted]d 1. Supervision of other normal pregnancy, antepartum -Anticipatory guidance for upcoming appts. -Reviewed GBS for next appt.   2. [redacted] weeks gestation of pregnancy -Reviewed complaints. -Reassured that pelvic pressure is anticipated in 3rd trimester. -Works two jobs; Encouraged rest when possible.   3. Muscle spasms of both lower extremities -Discussed increasing potassium in diet. -Discussed foods high in potassium including bananas, spinach, broccoli, sweet potatoes. -Encouraged usage of compression stockings esp since working on feet.   Preterm labor symptoms and general obstetric precautions including but not limited to vaginal bleeding, contractions, leaking of fluid and fetal movement were reviewed in detail with the patient. I discussed the assessment and treatment plan with the patient. The patient was provided an opportunity to ask questions and all were answered. The patient agreed with the plan and demonstrated an understanding of the instructions. The patient was advised to call back or seek an in-person office evaluation/go to MAU at [facility] for any urgent or concerning symptoms. Please refer to After Visit Summary for other counseling recommendations.   I provided 6 minutes of face-to-face time during this encounter.  No follow-ups on file.  Future Appointments  Date Time Provider Department Center  08/16/2020  3:40 PM Gerrit Heck, CNM [facility] None    Cherre Robins, CNM Center for [employer], [facility]

## 2020-08-26 IMAGING — US US MFM OB DETAIL+14 WK
1 series · 13 of 28 positions shown · non-contrast
Comparison: none

[Series 1: us mfm ob detail+14 wk · 13 of 92 slices shown]
[im 4/92]
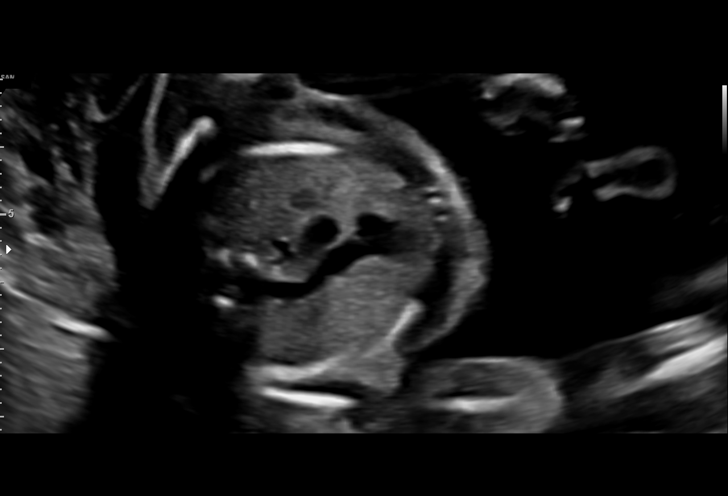
[im 11/92]
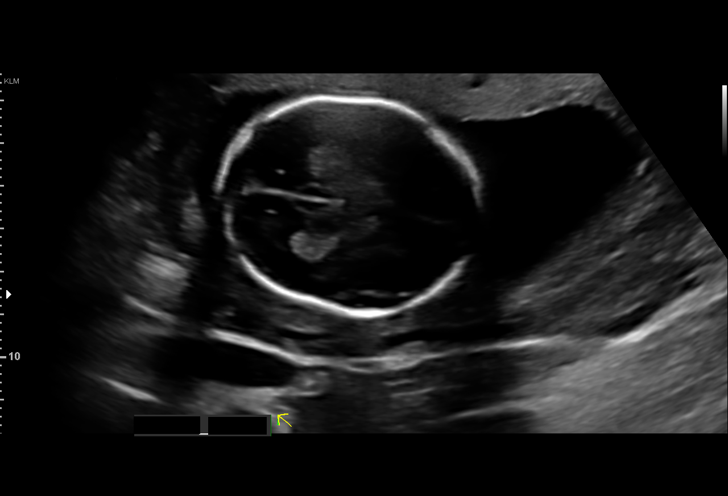
[im 17/92]
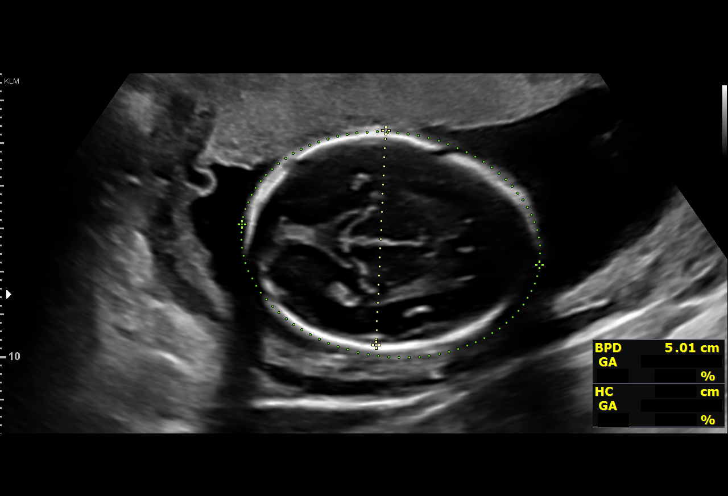
[im 24/92]
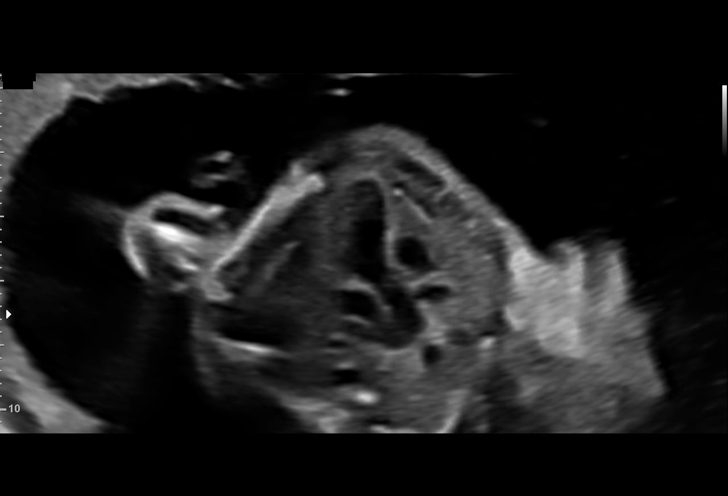
[im 31/92]
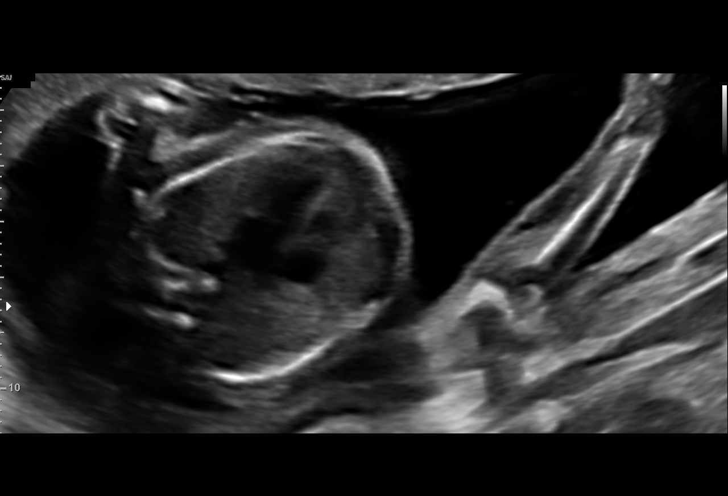
[im 38/92]
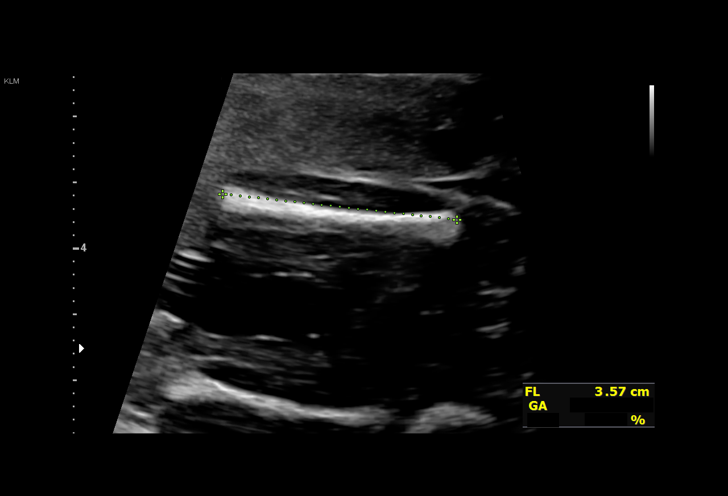
[im 48/92]
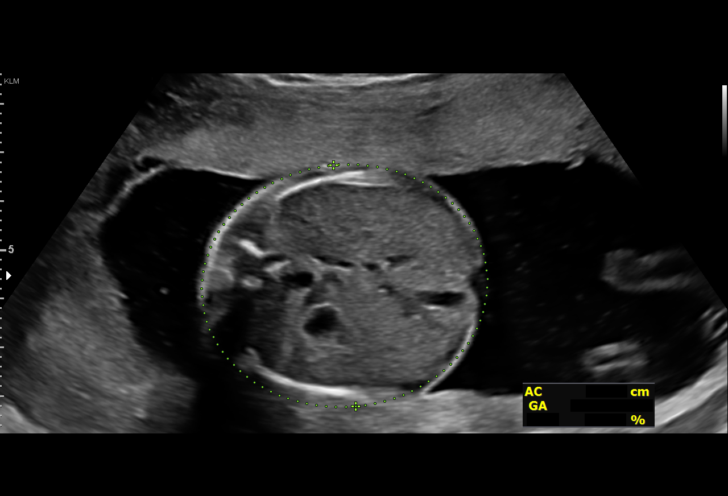
[im 54/92]
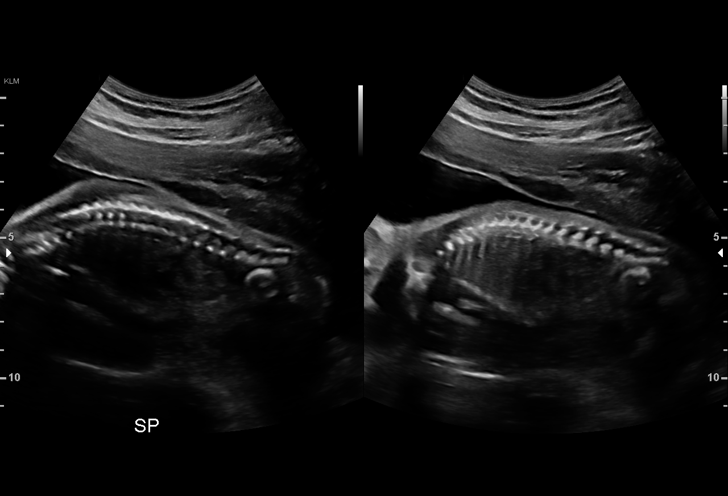
[im 61/92]
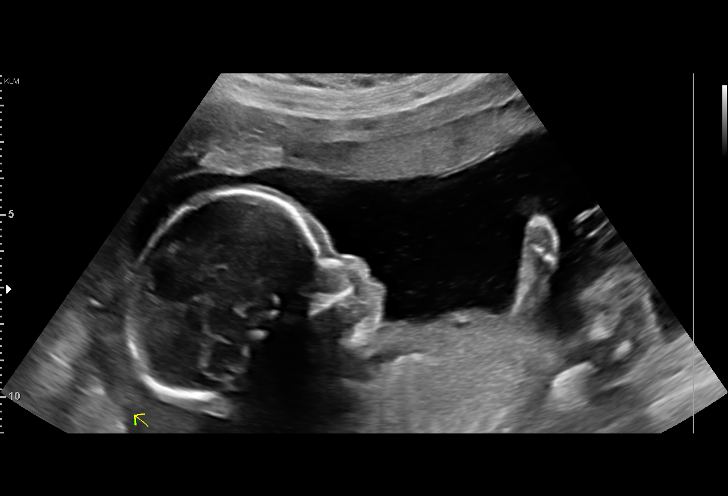
[im 68/92]
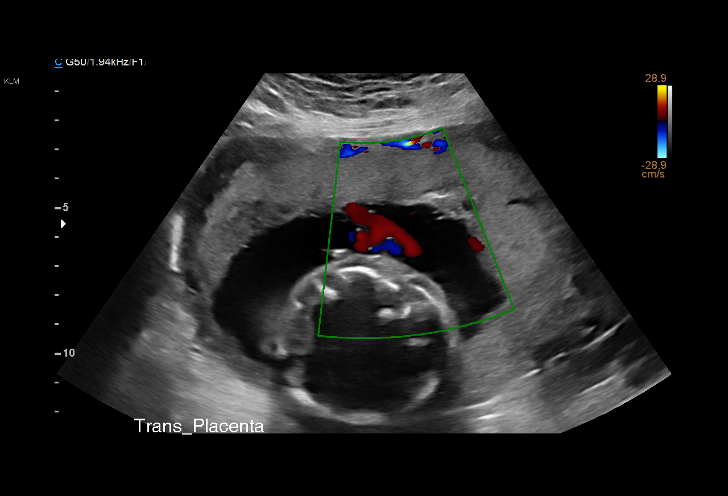
[im 75/92]
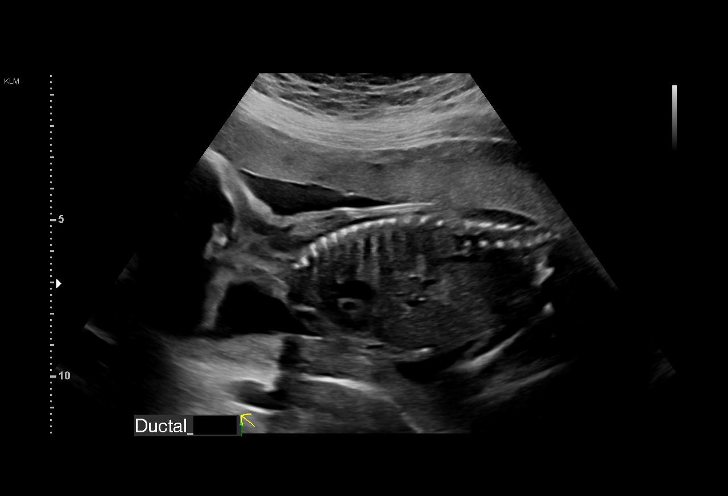
[im 81/92]
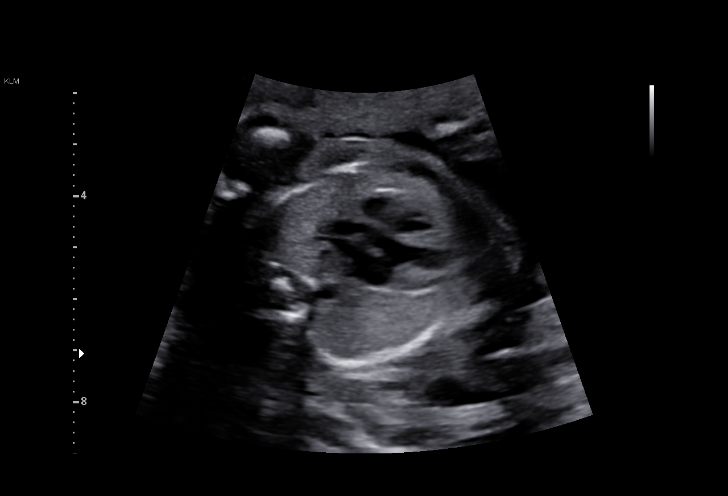
[im 88/92]
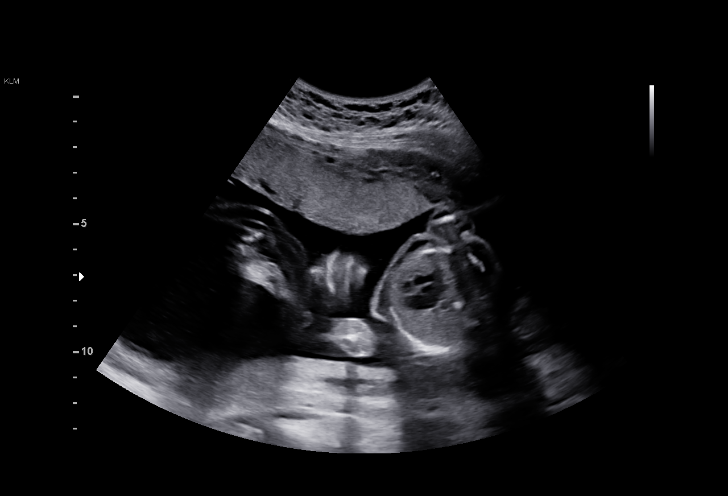

[13 of 28 positions shown; findings below may reference images not displayed]

Indications

 Obesity complicating pregnancy, second
 trimester
 21 weeks gestation of pregnancy
 Encounter for antenatal screening for
 malformations (low risk NIPS)
Vital Signs

 BMI:
Fetal Evaluation

 Num Of Fetuses:         1
 Fetal Heart Rate(bpm):  144
 Cardiac Activity:       Observed
 Presentation:           Breech
 Placenta:               Anterior
 P. Cord Insertion:      Visualized

 Amniotic Fluid
 AFI FV:      Within normal limits
Biometry

 BPD:      50.1  mm     G. Age:  21w 1d         44  %    CI:         69.7   %    70 - 86
                                                         FL/HC:      19.1   %    15.9 -
 HC:      191.5  mm     G. Age:  21w 3d         45  %    HC/AC:      1.14        1.06 -
 AC:      167.3  mm     G. Age:  21w 5d         58  %    FL/BPD:     72.9   %
 FL:       36.5  mm     G. Age:  21w 4d         52  %    FL/AC:      21.8   %    20 - 24
 HUM:      34.3  mm     G. Age:  21w 5d         58  %
 CER:      24.6  mm     G. Age:  22w 4d         91  %
 Est. FW:     439  gm    0 lb 15 oz      64  %
OB History

 Gravidity:    2         Term:   1        Prem:   0        SAB:   0
 TOP:          0       Ectopic:  0        Living: 1
Gestational Age

 LMP:           21w 2d        Date:  12/27/19                 EDD:   10/02/20
 U/S Today:     21w 3d                                        EDD:   10/01/20
 Best:          21w 2d     Det. By:  LMP  (12/27/19)          EDD:   10/02/20
Anatomy

 Cranium:               Appears normal         Aortic Arch:            Appears normal
 Cavum:                 Appears normal         Ductal Arch:            Appears normal
 Ventricles:            Appears normal         Diaphragm:              Appears normal
 Choroid Plexus:        Appears normal         Stomach:                Appears normal, left
                                                                       sided
 Cerebellum:            Appears normal         Abdomen:                Appears normal
 Posterior Fossa:       Appears normal         Abdominal Wall:         Appears nml (cord
                                                                       insert, abd wall)
 Nuchal Fold:           Appears normal         Cord Vessels:           Appears normal (3
                                                                       vessel cord)
 Face:                  Appears normal         Kidneys:                Appear normal
                        (orbits and profile)
 Lips:                  Appears normal         Bladder:                Appears normal
 Thoracic:              Appears normal         Spine:                  Appears normal
 Heart:                 Appears normal         Upper Extremities:      Appears normal
                        (4CH, axis, and
                        situs)
 RVOT:                  Appears normal         Lower Extremities:      Appears normal
 LVOT:                  Appears normal

 Other:  Heels and 5th digit visualized. 3VV and 3VTV visualized. Nasal bone
         visualized.
Cervix Uterus Adnexa

 Cervix
 Length:            3.8  cm.
 Normal appearance by transabdominal scan.

 Uterus
 No abnormality visualized.

 Right Ovary
 Within normal limits.

 Left Ovary
 Within normal limits.

 Cul De Sac
 No free fluid seen.

 Adnexa
 No abnormality visualized.
Comments

 This patient was seen for a detailed fetal anatomy scan.
 She denies any significant past medical history and denies
 any problems in her current pregnancy.
 She had a cell free DNA test earlier in her pregnancy which
 indicated a low risk for trisomy 21, 18, and 13. A female fetus
 is predicted.
 She was informed that the fetal growth and amniotic fluid
 level were appropriate for her gestational age.
 There were no obvious fetal anomalies noted on today's
 ultrasound exam.
 The patient was informed that anomalies may be missed due
 to technical limitations. If the fetus is in a suboptimal position
 or maternal habitus is increased, visualization of the fetus in
 the maternal uterus may be impaired.
 Follow up as indicated.

## 2020-09-04 ENCOUNTER — Encounter: Payer: Self-pay | Admitting: Advanced Practice Midwife

## 2020-09-04 ENCOUNTER — Ambulatory Visit (INDEPENDENT_AMBULATORY_CARE_PROVIDER_SITE_OTHER): Payer: Medicaid Other | Admitting: Advanced Practice Midwife

## 2020-09-04 ENCOUNTER — Other Ambulatory Visit (HOSPITAL_COMMUNITY)
Admission: RE | Admit: 2020-09-04 | Discharge: 2020-09-04 | Disposition: A | Discharge status: Home / Self Care | Payer: Medicaid Other | Source: Ambulatory Visit | Attending: Advanced Practice Midwife | Admitting: Advanced Practice Midwife

## 2020-09-04 ENCOUNTER — Other Ambulatory Visit: Payer: Self-pay

## 2020-09-04 VITALS — BP 120/74 | HR 96 | Wt 204.0 lb

## 2020-09-04 DIAGNOSIS — Z3A36 36 weeks gestation of pregnancy: Secondary | ICD-10-CM

## 2020-09-04 DIAGNOSIS — Z3480 Encounter for supervision of other normal pregnancy, unspecified trimester: Secondary | ICD-10-CM | POA: Insufficient documentation

## 2020-09-04 NOTE — Progress Notes (Signed)
ROB 36w   GBS due today. Pt would also like cervix check.  CC: None

## 2020-09-04 NOTE — Progress Notes (Signed)
° °  PRENATAL VISIT NOTE  Subjective:  Heidi Rocha is a 29 y.o. G2P1001 at [redacted]w[redacted]d being seen today for ongoing prenatal care.  She is currently monitored for the following issues for this low-risk pregnancy and has Supervision of other normal pregnancy, antepartum on their problem list.  Patient reports no complaints.  Contractions: Not present. Vag. Bleeding: None.  Movement: Present. Denies leaking of fluid.   The following portions of the patient's history were reviewed and updated as appropriate: allergies, current medications, past family history, past medical history, past social history, past surgical history and problem list.   Objective:   Vitals:   09/04/20 1532  BP: 120/74  Pulse: 96  Weight: 204 lb (92.5 kg)    Fetal Status: Fetal Heart Rate (bpm): 146 Fundal Height: 36 cm Movement: Present  Presentation: Vertex  General:  Alert, oriented and cooperative. Patient is in no acute distress.  Skin: Skin is warm and dry. No rash noted.   Cardiovascular: Normal heart rate noted  Respiratory: Normal respiratory effort, no problems with respiration noted  Abdomen: Soft, gravid, appropriate for gestational age.  Pain/Pressure: Present     Pelvic: Cervical exam performed in the presence of a chaperone Dilation: 1 Effacement (%): 50 Station: -2  Extremities: Normal range of motion.  Edema: Trace  Mental Status: Normal mood and affect. Normal behavior. Normal judgment and thought content.   Assessment and Plan:  Pregnancy: G2P1001 at [redacted]w[redacted]d 1. [redacted] weeks gestation of pregnancy   2. Supervision of other normal pregnancy, antepartum --Anticipatory guidance about next visits/weeks of pregnancy given. --Cervical ripening/labor readiness discussed including the [employer], evening primrose oil, and raspberry leaf tea --Next visit in 2 weeks in office  Term labor symptoms and general obstetric precautions including but not limited to vaginal bleeding, contractions, leaking of  fluid and fetal movement were reviewed in detail with the patient. Please refer to After Visit Summary for other counseling recommendations.   No follow-ups on file.  Future Appointments  Date Time Provider Department Center  09/19/2020  3:30 PM Adam Phenix, MD [facility] None    Sharen Counter, CNM

## 2020-09-04 NOTE — Patient Instructions (Signed)
Things to Try After 37 weeks to Encourage Labor/Get Ready for Labor:   1.  Try the Miles Circuit at www.milescircuit.com daily to improve baby's position and encourage the onset of labor.  2. Walk a little and rest a little every day.  Change positions often.  3. Cervical Ripening: May try one or both a. Red Raspberry Leaf capsules or tea:  two 300mg or 400mg tablets with each meal, 2-3 times a day, or 1-3 cups of tea daily  Potential Side Effects Of Raspberry Leaf:  Most women do not experience any side effects from drinking raspberry leaf tea. However, nausea and loose stools are possible   b. Evening Primrose Oil capsules: may take 1 to 3 capsules daily. Take 1-2 capsules by mouth each day and place one capsule vaginally at night.  You may also prick the vaginal capsule to release the oil prior to inserting in the vagina. Some of the potential side effects:  Upset stomach  Loose stools or diarrhea  Headaches  Nausea  4. Sex (and especially sex with orgasm) can also help the cervix ripen and encourage labor onset.    Reasons to return to MAU at [facility]:  1.  Contractions are  5 minutes apart or less, each last 1 minute, these have been going on for 1-2 hours, and you cannot walk or talk during them 2.  You have a large gush of fluid, or a trickle of fluid that will not stop and you have to wear a pad 3.  You have bleeding that is bright red, heavier than spotting--like menstrual bleeding (spotting can be normal in early labor or after a check of your cervix) 4.  You do not feel the baby moving like he/she normally does 

## 2020-09-05 LAB — CERVICOVAGINAL ANCILLARY ONLY
Chlamydia: NEGATIVE
Comment: NEGATIVE
Comment: NEGATIVE
Comment: NORMAL
Neisseria Gonorrhea: NEGATIVE
Trichomonas: NEGATIVE

## 2020-09-06 LAB — STREP GP B NAA: Strep Gp B NAA: POSITIVE — AB

## 2020-09-07 ENCOUNTER — Encounter: Payer: Self-pay | Admitting: Advanced Practice Midwife

## 2020-09-07 DIAGNOSIS — O99820 Streptococcus B carrier state complicating pregnancy: Secondary | ICD-10-CM | POA: Insufficient documentation

## 2020-09-19 ENCOUNTER — Other Ambulatory Visit: Payer: Self-pay

## 2020-09-19 ENCOUNTER — Ambulatory Visit (INDEPENDENT_AMBULATORY_CARE_PROVIDER_SITE_OTHER): Payer: Medicaid Other | Admitting: Obstetrics & Gynecology

## 2020-09-19 ENCOUNTER — Encounter: Payer: Self-pay | Admitting: Obstetrics & Gynecology

## 2020-09-19 VITALS — BP 121/77 | HR 101 | Wt 204.0 lb

## 2020-09-19 DIAGNOSIS — O99820 Streptococcus B carrier state complicating pregnancy: Secondary | ICD-10-CM

## 2020-09-19 DIAGNOSIS — Z3480 Encounter for supervision of other normal pregnancy, unspecified trimester: Secondary | ICD-10-CM

## 2020-09-19 NOTE — Progress Notes (Signed)
ROB   CC: None  declines cervix check today.

## 2020-09-19 NOTE — Patient Instructions (Addendum)
Group B Streptococcus Infection During Pregnancy °Group B Streptococcus (GBS) is a type of bacteria that is often found in healthy people. It is commonly found in the rectum, vagina, and intestines. In people who are healthy and not pregnant, the bacteria rarely cause serious illness or complications. However, women who test positive for GBS during pregnancy can pass the bacteria to the baby during childbirth. This can cause serious infection in the baby after birth. °Women with GBS may also have infections during their pregnancy or soon after childbirth. The infections include urinary tract infections (UTIs) or infections of the uterus. GBS also increases a woman's risk of complications during pregnancy, such as early labor or delivery, miscarriage, or stillbirth. Routine testing for GBS is recommended for all pregnant women. °What are the causes? °This condition is caused by bacteria called Streptococcus agalactiae. °What increases the risk? °You may have a higher risk for GBS infection during pregnancy if you had one during a past pregnancy. °What are the signs or symptoms? °In most cases, GBS infection does not cause symptoms in pregnant women. If symptoms exist, they may include: °· Labor that starts before the 37th week of pregnancy. °· A UTI or bladder infection. This may cause a fever, frequent urination, or pain and burning during urination. °· Fever during labor. There can also be a rapid heartbeat in the mother or baby. °Rare but serious symptoms of a GBS infection in women include: °· Blood infection (septicemia). This may cause fever, chills, or confusion. °· Lung infection (pneumonia). This may cause fever, chills, cough, rapid breathing, chest pain, or difficulty breathing. °· Bone, joint, skin, or soft tissue infection. °How is this diagnosed? °You may be screened for GBS between week 35 and week 37 of pregnancy. If you have symptoms of preterm labor, you may be screened earlier. This condition is  diagnosed based on lab test results from: °· A swab of fluid from the vagina and rectum. °· A urine sample. °How is this treated? °This condition is treated with antibiotic medicine. Antibiotic medicine may be given: °· To you when you go into labor, or as soon as your water breaks. The medicines will continue until after you give birth. If you are having a cesarean delivery, you do not need antibiotics unless your water has broken. °· To your baby, if he or she requires treatment. Your health care provider will check your baby to decide if he or she needs antibiotics to prevent a serious infection. °Follow these instructions at home: °· Take over-the-counter and prescription medicines only as told by your health care provider. °· Take your antibiotic medicine as told by your health care provider. Do not stop taking the antibiotic even if you start to feel better. °· Keep all pre-birth (prenatal) visits and follow-up visits as told by your health care provider. This is important. °Contact a health care provider if: °· You have pain or burning when you urinate. °· You have to urinate more often than usual. °· You have a fever or chills. °· You develop a bad-smelling vaginal discharge. °Get help right away if: °· Your water breaks. °· You go into labor. °· You have severe pain in your abdomen. °· You have difficulty breathing. °· You have chest pain. °These symptoms may represent a serious problem that is an emergency. Do not wait to see if the symptoms will go away. Get medical help right away. Call your local emergency services (911 in the [place] Do not drive yourself to   the hospital. Summary  GBS is a type of bacteria that is common in healthy people.  During pregnancy, colonization with GBS can cause serious complications for you or your baby.  Your health care provider will screen you between 35 and 37 weeks of pregnancy to determine if you are colonized with GBS.  If you are colonized with GBS during  pregnancy, your health care provider will recommend antibiotics through an IV during labor.  After delivery, your baby will be evaluated for complications related to potential GBS infection and may require antibiotics to prevent a serious infection. This information is not intended to replace advice given to you by your health care provider. Make sure you discuss any questions you have with your health care provider. Document Revised: 06/20/2019 Document Reviewed: 06/20/2019 Elsevier Patient Education  2020 [employer] Vaginal Delivery  Vaginal delivery means that you give birth by pushing your baby out of your birth canal (vagina). A team of health care providers will help you before, during, and after vaginal delivery. Birth experiences are unique for every woman and every pregnancy, and birth experiences vary depending on where you choose to give birth. What happens when I arrive at the birth center or hospital? Once you are in labor and have been admitted into the hospital or birth center, your health care provider may:  Review your pregnancy history and any concerns that you have.  Insert an IV into one of your veins. This may be used to give you fluids and medicines.  Check your blood pressure, pulse, temperature, and heart rate (vital signs).  Check whether your bag of water (amniotic sac) has broken (ruptured).  Talk with you about your birth plan and discuss pain control options. Monitoring Your health care provider may monitor your contractions (uterine monitoring) and your baby's heart rate (fetal monitoring). You may need to be monitored:  Often, but not continuously (intermittently).  All the time or for long periods at a time (continuously). Continuous monitoring may be needed if: ? You are taking certain medicines, such as medicine to relieve pain or make your contractions stronger. ? You have pregnancy or labor complications. Monitoring may be done by:  Placing a  special stethoscope or a handheld monitoring device on your abdomen to check your baby's heartbeat and to check for contractions.  Placing monitors on your abdomen (external monitors) to record your baby's heartbeat and the frequency and length of contractions.  Placing monitors inside your uterus through your vagina (internal monitors) to record your baby's heartbeat and the frequency, length, and strength of your contractions. Depending on the type of monitor, it may remain in your uterus or on your baby's head until birth.  Telemetry. This is a type of continuous monitoring that can be done with external or internal monitors. Instead of having to stay in bed, you are able to move around during telemetry. Physical exam Your health care provider may perform frequent physical exams. This may include:  Checking how and where your baby is positioned in your uterus.  Checking your cervix to determine: ? Whether it is thinning out (effacing). ? Whether it is opening up (dilating). What happens during labor and delivery?  Normal labor and delivery is divided into the following three stages: Stage 1  This is the longest stage of labor.  This stage can last for hours or days.  Throughout this stage, you will feel contractions. Contractions generally feel mild, infrequent, and irregular at first. They get stronger, more frequent (  about every 2-3 minutes), and more regular as you move through this stage.  This stage ends when your cervix is completely dilated to 4 inches (10 cm) and completely effaced. Stage 2  This stage starts once your cervix is completely effaced and dilated and lasts until the delivery of your baby.  This stage may last from 20 minutes to 2 hours.  This is the stage where you will feel an urge to push your baby out of your vagina.  You may feel stretching and burning pain, especially when the widest part of your baby's head passes through the vaginal opening  (crowning).  Once your baby is delivered, the umbilical cord will be clamped and cut. This usually occurs after waiting a period of 1-2 minutes after delivery.  Your baby will be placed on your bare chest (skin-to-skin contact) in an upright position and covered with a warm blanket. Watch your baby for feeding cues, like rooting or sucking, and help the baby to your breast for his or her first feeding. Stage 3  This stage starts immediately after the birth of your baby and ends after you deliver the placenta.  This stage may take anywhere from 5 to 30 minutes.  After your baby has been delivered, you will feel contractions as your body expels the placenta and your uterus contracts to control bleeding. What can I expect after labor and delivery?  After labor is over, you and your baby will be monitored closely until you are ready to go home to ensure that you are both healthy. Your health care team will teach you how to care for yourself and your baby.  You and your baby will stay in the same room (rooming in) during your hospital stay. This will encourage early bonding and successful breastfeeding.  You may continue to receive fluids and medicines through an IV.  Your uterus will be checked and massaged regularly (fundal massage).  You will have some soreness and pain in your abdomen, vagina, and the area of skin between your vaginal opening and your anus (perineum).  If an incision was made near your vagina (episiotomy) or if you had some vaginal tearing during delivery, cold compresses may be placed on your episiotomy or your tear. This helps to reduce pain and swelling.  You may be given a squirt bottle to use instead of wiping when you go to the bathroom. To use the squirt bottle, follow these steps: ? Before you urinate, fill the squirt bottle with warm water. Do not use hot water. ? After you urinate, while you are sitting on the toilet, use the squirt bottle to rinse the area  around your urethra and vaginal opening. This rinses away any urine and blood. ? Fill the squirt bottle with clean water every time you use the bathroom.  It is normal to have vaginal bleeding after delivery. Wear a sanitary pad for vaginal bleeding and discharge. Summary  Vaginal delivery means that you will give birth by pushing your baby out of your birth canal (vagina).  Your health care provider may monitor your contractions (uterine monitoring) and your baby's heart rate (fetal monitoring).  Your health care provider may perform a physical exam.  Normal labor and delivery is divided into three stages.  After labor is over, you and your baby will be monitored closely until you are ready to go home. This information is not intended to replace advice given to you by your health care provider. Make sure you discuss  any questions you have with your health care provider. Document Revised: 12/29/2017 Document Reviewed: 12/29/2017 Elsevier Patient Education  2020 [employer].

## 2020-09-19 NOTE — Progress Notes (Signed)
   PRENATAL VISIT NOTE  Subjective:  Heidi Rocha is a 29 y.o. G2P1001 at [redacted]w[redacted]d being seen today for ongoing prenatal care.  She is currently monitored for the following issues for this low-risk pregnancy and has Supervision of other normal pregnancy, antepartum and GBS (group B Streptococcus carrier), +RV culture, currently pregnant on their problem list.  Patient reports no complaints.  Contractions: Not present. Vag. Bleeding: None.  Movement: Present. Denies leaking of fluid.   The following portions of the patient's history were reviewed and updated as appropriate: allergies, current medications, past family history, past medical history, past social history, past surgical history and problem list.   Objective:   Vitals:   09/19/20 1537  BP: 121/77  Pulse: (!) 101  Weight: 204 lb (92.5 kg)    Fetal Status: Fetal Heart Rate (bpm): 140   Movement: Present     General:  Alert, oriented and cooperative. Patient is in no acute distress.  Skin: Skin is warm and dry. No rash noted.   Cardiovascular: Normal heart rate noted  Respiratory: Normal respiratory effort, no problems with respiration noted  Abdomen: Soft, gravid, appropriate for gestational age.  Pain/Pressure: Present     Pelvic: Cervical exam deferred        Extremities: Normal range of motion.  Edema: Trace  Mental Status: Normal mood and affect. Normal behavior. Normal judgment and thought content.   Assessment and Plan:  Pregnancy: G2P1001 at [redacted]w[redacted]d 1. Supervision of other normal pregnancy, antepartum Doing well  2. GBS (group B Streptococcus carrier), +RV culture, currently pregnant Will receive PCN during labor  Term labor symptoms and general obstetric precautions including but not limited to vaginal bleeding, contractions, leaking of fluid and fetal movement were reviewed in detail with the patient. Please refer to After Visit Summary for other counseling recommendations.   Return in about 1 week (around  09/26/2020) for 2 notes to stop work after 39 weeks.  No future appointments.  Scheryl Darter, MD

## 2020-09-25 ENCOUNTER — Other Ambulatory Visit: Payer: Self-pay

## 2020-09-25 ENCOUNTER — Encounter: Payer: Self-pay | Admitting: Obstetrics and Gynecology

## 2020-09-25 ENCOUNTER — Ambulatory Visit (INDEPENDENT_AMBULATORY_CARE_PROVIDER_SITE_OTHER): Payer: Medicaid Other | Admitting: Obstetrics and Gynecology

## 2020-09-25 VITALS — BP 120/75 | HR 109 | Wt 205.0 lb

## 2020-09-25 DIAGNOSIS — Z3480 Encounter for supervision of other normal pregnancy, unspecified trimester: Secondary | ICD-10-CM

## 2020-09-25 DIAGNOSIS — O99820 Streptococcus B carrier state complicating pregnancy: Secondary | ICD-10-CM

## 2020-09-25 NOTE — Progress Notes (Signed)
   PRENATAL VISIT NOTE  Subjective:  Heidi Rocha is a 29 y.o. G2P1001 at [redacted]w[redacted]d being seen today for ongoing prenatal care.  She is currently monitored for the following issues for this low-risk pregnancy and has Supervision of other normal pregnancy, antepartum and GBS (group B Streptococcus carrier), +RV culture, currently pregnant on their problem list.  Patient reports no complaints.  Contractions: Not present. Vag. Bleeding: None.  Movement: Present. Denies leaking of fluid.   The following portions of the patient's history were reviewed and updated as appropriate: allergies, current medications, past family history, past medical history, past social history, past surgical history and problem list.   Objective:   Vitals:   09/25/20 1611  BP: 120/75  Pulse: (!) 109  Weight: 205 lb (93 kg)    Fetal Status: Fetal Heart Rate (bpm): 149 Fundal Height: 38 cm Movement: Present  Presentation: Vertex  General:  Alert, oriented and cooperative. Patient is in no acute distress.  Skin: Skin is warm and dry. No rash noted.   Cardiovascular: Normal heart rate noted  Respiratory: Normal respiratory effort, no problems with respiration noted  Abdomen: Soft, gravid, appropriate for gestational age.  Pain/Pressure: Present     Pelvic: Cervical exam performed in the presence of a chaperone Dilation: 2 Effacement (%): 50 Station: -3  Extremities: Normal range of motion.  Edema: Mild pitting, slight indentation  Mental Status: Normal mood and affect. Normal behavior. Normal judgment and thought content.   Assessment and Plan:  Pregnancy: G2P1001 at [redacted]w[redacted]d 1. Supervision of other normal pregnancy, antepartum Patient is doing well without complaints Will plan for IOL at 41 weeks  2. GBS (group B Streptococcus carrier), +RV culture, currently pregnant Prophylaxis in labor  Term labor symptoms and general obstetric precautions including but not limited to vaginal bleeding, contractions,  leaking of fluid and fetal movement were reviewed in detail with the patient. Please refer to After Visit Summary for other counseling recommendations.   Return in about 1 week (around 10/02/2020) for in person, ROB, Low risk.  Future Appointments  Date Time Provider Department Center  10/02/2020  3:30 PM Johnny Bridge, MD [facility] None    Catalina Antigua, MD

## 2020-09-27 ENCOUNTER — Encounter (HOSPITAL_COMMUNITY): Payer: Self-pay | Admitting: Obstetrics & Gynecology

## 2020-09-27 ENCOUNTER — Other Ambulatory Visit: Payer: Self-pay

## 2020-09-27 ENCOUNTER — Inpatient Hospital Stay (HOSPITAL_COMMUNITY)
Admission: AD | Admit: 2020-09-27 | Discharge: 2020-09-29 | DRG: 807 | Disposition: A | Discharge status: Home / Self Care | Payer: Medicaid Other | Attending: Obstetrics and Gynecology | Admitting: Obstetrics and Gynecology

## 2020-09-27 ENCOUNTER — Encounter (HOSPITAL_COMMUNITY): Payer: Self-pay | Admitting: Anesthesiology

## 2020-09-27 ENCOUNTER — Inpatient Hospital Stay (HOSPITAL_COMMUNITY): Payer: Medicaid Other | Admitting: Anesthesiology

## 2020-09-27 DIAGNOSIS — O99824 Streptococcus B carrier state complicating childbirth: Secondary | ICD-10-CM | POA: Diagnosis present

## 2020-09-27 DIAGNOSIS — Z20822 Contact with and (suspected) exposure to covid-19: Secondary | ICD-10-CM | POA: Diagnosis present

## 2020-09-27 DIAGNOSIS — Z3480 Encounter for supervision of other normal pregnancy, unspecified trimester: Secondary | ICD-10-CM

## 2020-09-27 DIAGNOSIS — Z3A39 39 weeks gestation of pregnancy: Secondary | ICD-10-CM

## 2020-09-27 DIAGNOSIS — O99820 Streptococcus B carrier state complicating pregnancy: Secondary | ICD-10-CM

## 2020-09-27 DIAGNOSIS — O4290 Premature rupture of membranes, unspecified as to length of time between rupture and onset of labor, unspecified weeks of gestation: Secondary | ICD-10-CM | POA: Diagnosis present

## 2020-09-27 DIAGNOSIS — Z3490 Encounter for supervision of normal pregnancy, unspecified, unspecified trimester: Secondary | ICD-10-CM | POA: Diagnosis present

## 2020-09-27 DIAGNOSIS — B951 Streptococcus, group B, as the cause of diseases classified elsewhere: Secondary | ICD-10-CM

## 2020-09-27 DIAGNOSIS — O4292 Full-term premature rupture of membranes, unspecified as to length of time between rupture and onset of labor: Secondary | ICD-10-CM | POA: Diagnosis present

## 2020-09-27 DIAGNOSIS — O4202 Full-term premature rupture of membranes, onset of labor within 24 hours of rupture: Secondary | ICD-10-CM

## 2020-09-27 DIAGNOSIS — O26893 Other specified pregnancy related conditions, third trimester: Secondary | ICD-10-CM | POA: Diagnosis present

## 2020-09-27 LAB — CBC
HCT: 32.1 % — ABNORMAL LOW (ref 36.0–46.0)
Hemoglobin: 9.7 g/dL — ABNORMAL LOW (ref 12.0–15.0)
MCH: 24 pg — ABNORMAL LOW (ref 26.0–34.0)
MCHC: 30.2 g/dL (ref 30.0–36.0)
MCV: 79.5 fL — ABNORMAL LOW (ref 80.0–100.0)
Platelets: 246 10*3/uL (ref 150–400)
RBC: 4.04 MIL/uL (ref 3.87–5.11)
RDW: 17.5 % — ABNORMAL HIGH (ref 11.5–15.5)
WBC: 12.8 10*3/uL — ABNORMAL HIGH (ref 4.0–10.5)
nRBC: 0.2 % (ref 0.0–0.2)

## 2020-09-27 LAB — TYPE AND SCREEN
ABO/RH(D): O POS
Antibody Screen: NEGATIVE

## 2020-09-27 LAB — RESPIRATORY PANEL BY RT PCR (FLU A&B, COVID)
Influenza A by PCR: NEGATIVE
Influenza B by PCR: NEGATIVE
SARS Coronavirus 2 by RT PCR: NEGATIVE

## 2020-09-27 LAB — AMNISURE RUPTURE OF MEMBRANE (ROM) NOT AT ARMC: Amnisure ROM: NEGATIVE

## 2020-09-27 MED ORDER — ACETAMINOPHEN 325 MG PO TABS: 650.0000 mg | ORAL_TABLET | ORAL | Status: DC | PRN

## 2020-09-27 MED ORDER — OXYTOCIN BOLUS FROM INFUSION
333.0000 mL | Freq: Once | INTRAVENOUS | Status: AC
  Administered 2020-09-27: 333 mL via INTRAVENOUS

## 2020-09-27 MED ORDER — EPHEDRINE 5 MG/ML INJ: 10.0000 mg | INTRAVENOUS | Status: DC | PRN

## 2020-09-27 MED ORDER — LACTATED RINGERS IV SOLN: 500.0000 mL | INTRAVENOUS | Status: DC | PRN

## 2020-09-27 MED ORDER — LIDOCAINE HCL (PF) 1 % IJ SOLN
INTRAMUSCULAR | Status: DC | PRN
  Administered 2020-09-27 (×2): 6 mL via EPIDURAL

## 2020-09-27 MED ORDER — BENZOCAINE-MENTHOL 20-0.5 % EX AERO: 1.0000 "application " | INHALATION_SPRAY | CUTANEOUS | Status: DC | PRN

## 2020-09-27 MED ORDER — FENTANYL-BUPIVACAINE-NACL 0.5-0.125-0.9 MG/250ML-% EP SOLN
12.0000 mL/h | EPIDURAL | Status: DC | PRN
  Filled 2020-09-27: qty 250

## 2020-09-27 MED ORDER — LACTATED RINGERS IV SOLN: 500.0000 mL | Freq: Once | INTRAVENOUS | Status: DC

## 2020-09-27 MED ORDER — OXYTOCIN-SODIUM CHLORIDE 30-0.9 UT/500ML-% IV SOLN
2.5000 [IU]/h | INTRAVENOUS | Status: DC
  Filled 2020-09-27: qty 500

## 2020-09-27 MED ORDER — SODIUM CHLORIDE 0.9 % IV SOLN
5.0000 10*6.[IU] | Freq: Once | INTRAVENOUS | Status: AC
  Administered 2020-09-27: 5 10*6.[IU] via INTRAVENOUS
  Filled 2020-09-27: qty 5

## 2020-09-27 MED ORDER — BUPIVACAINE HCL (PF) 0.5 % IJ SOLN
INTRAMUSCULAR | Status: DC | PRN
  Administered 2020-09-27: 6 mL

## 2020-09-27 MED ORDER — LACTATED RINGERS IV BOLUS: 1000.0000 mL | Freq: Once | INTRAVENOUS | Status: DC

## 2020-09-27 MED ORDER — PRENATAL MULTIVITAMIN CH
1.0000 | ORAL_TABLET | Freq: Every day | ORAL | Status: DC
  Filled 2020-09-27: qty 1

## 2020-09-27 MED ORDER — SOD CITRATE-CITRIC ACID 500-334 MG/5ML PO SOLN: 30.0000 mL | ORAL | Status: DC | PRN

## 2020-09-27 MED ORDER — PENICILLIN G POT IN DEXTROSE 60000 UNIT/ML IV SOLN: 3.0000 10*6.[IU] | INTRAVENOUS | Status: DC

## 2020-09-27 MED ORDER — HYDROXYZINE HCL 50 MG PO TABS: 50.0000 mg | ORAL_TABLET | Freq: Four times a day (QID) | ORAL | Status: DC | PRN

## 2020-09-27 MED ORDER — PHENYLEPHRINE 40 MCG/ML (10ML) SYRINGE FOR IV PUSH (FOR BLOOD PRESSURE SUPPORT): 80.0000 ug | PREFILLED_SYRINGE | INTRAVENOUS | Status: DC | PRN

## 2020-09-27 MED ORDER — DIPHENHYDRAMINE HCL 25 MG PO CAPS: 25.0000 mg | ORAL_CAPSULE | Freq: Four times a day (QID) | ORAL | Status: DC | PRN

## 2020-09-27 MED ORDER — WITCH HAZEL-GLYCERIN EX PADS: 1.0000 "application " | MEDICATED_PAD | CUTANEOUS | Status: DC | PRN

## 2020-09-27 MED ORDER — IBUPROFEN 100 MG/5ML PO SUSP
600.0000 mg | Freq: Four times a day (QID) | ORAL | Status: DC
  Administered 2020-09-27 – 2020-09-29 (×7): 600 mg via ORAL
  Filled 2020-09-27 (×7): qty 30

## 2020-09-27 MED ORDER — OXYCODONE-ACETAMINOPHEN 5-325 MG PO TABS: 1.0000 | ORAL_TABLET | ORAL | Status: DC | PRN

## 2020-09-27 MED ORDER — DIPHENHYDRAMINE HCL 50 MG/ML IJ SOLN: 12.5000 mg | INTRAMUSCULAR | Status: DC | PRN

## 2020-09-27 MED ORDER — LACTATED RINGERS IV SOLN: INTRAVENOUS | Status: DC

## 2020-09-27 MED ORDER — ONDANSETRON HCL 4 MG/2ML IJ SOLN: 4.0000 mg | Freq: Four times a day (QID) | INTRAMUSCULAR | Status: DC | PRN

## 2020-09-27 MED ORDER — FLEET ENEMA 7-19 GM/118ML RE ENEM: 1.0000 | ENEMA | Freq: Every day | RECTAL | Status: DC | PRN

## 2020-09-27 MED ORDER — SODIUM CHLORIDE (PF) 0.9 % IJ SOLN
INTRAMUSCULAR | Status: DC | PRN
  Administered 2020-09-27: 12 mL/h via EPIDURAL

## 2020-09-27 MED ORDER — ONDANSETRON HCL 4 MG/2ML IJ SOLN: 4.0000 mg | INTRAMUSCULAR | Status: DC | PRN

## 2020-09-27 MED ORDER — SENNOSIDES-DOCUSATE SODIUM 8.6-50 MG PO TABS
2.0000 | ORAL_TABLET | ORAL | Status: DC
  Administered 2020-09-28: 2 via ORAL
  Filled 2020-09-27 (×2): qty 2

## 2020-09-27 MED ORDER — SIMETHICONE 80 MG PO CHEW: 80.0000 mg | CHEWABLE_TABLET | ORAL | Status: DC | PRN

## 2020-09-27 MED ORDER — FENTANYL CITRATE (PF) 100 MCG/2ML IJ SOLN: 50.0000 ug | INTRAMUSCULAR | Status: DC | PRN

## 2020-09-27 MED ORDER — OXYCODONE-ACETAMINOPHEN 5-325 MG PO TABS: 2.0000 | ORAL_TABLET | ORAL | Status: DC | PRN

## 2020-09-27 MED ORDER — COCONUT OIL OIL: 1.0000 "application " | TOPICAL_OIL | Status: DC | PRN

## 2020-09-27 MED ORDER — ONDANSETRON HCL 4 MG PO TABS: 4.0000 mg | ORAL_TABLET | ORAL | Status: DC | PRN

## 2020-09-27 MED ORDER — DIBUCAINE (PERIANAL) 1 % EX OINT: 1.0000 "application " | TOPICAL_OINTMENT | CUTANEOUS | Status: DC | PRN

## 2020-09-27 MED ORDER — TETANUS-DIPHTH-ACELL PERTUSSIS 5-2.5-18.5 LF-MCG/0.5 IM SUSP: 0.5000 mL | Freq: Once | INTRAMUSCULAR | Status: DC

## 2020-09-27 MED ORDER — LIDOCAINE HCL (PF) 1 % IJ SOLN: 30.0000 mL | INTRAMUSCULAR | Status: DC | PRN

## 2020-09-27 MED ORDER — IBUPROFEN 600 MG PO TABS: 600.0000 mg | ORAL_TABLET | Freq: Four times a day (QID) | ORAL | Status: DC

## 2020-09-27 NOTE — MAU Provider Note (Signed)
First Provider Initiated Contact with Patient 09/27/20 [id number]      S: Ms. Heidi Rocha is a 29 y.o. G2P1001 at [redacted]w[redacted]d  who presents to [facility]U today complaining of leaking of fluid since 09/25/2020 around 1500. She had an OB visit that day she did not mention she was leaking at that visit. She also has continued to feel leaking today. She denies vaginal bleeding. She endorses contractions. She reports normal fetal movement.    Cervix checked in the office and patient reports that she was 1-2 cm at that time.   O: BP 124/69 (BP Location: Right Arm)   Pulse (!) 104   Temp 98.4 F (36.9 C) (Oral)   Resp 20   Ht 5\' 4"  (1.626 m)   Wt 94 kg   LMP 12/27/2019   SpO2 100%   BMI 35.57 kg/m    GENERAL: Well-developed, well-nourished female in no acute distress.  HEAD: Normocephalic, atraumatic.  CHEST: Normal effort of breathing, regular heart rate ABDOMEN: Soft, nontender, gravid PELVIC: Normal external female genitalia. Vagina is pink and rugated. Grossly ruptures with MSF   Cervical exam:    Dilation: 1 Effacement (%): 50 Station: -3 Presentation: Vertex Exam by:: [email or web address] CNM    Fetal Monitoring: Baseline: 150 Variability: moderate/minimal  Accelerations: 10x10 Decelerations: variables Contractions: q 2 mins Category II  No results found for this or any previous visit (from the past 24 hour(s)).   A: SIUP at [redacted]w[redacted]d  Cat II tracing at this time  - IV fluid bolus started  -+Scalp stim with cervical exam  SROM  P: Admit to labor and delivery   Team notified they will place orders for admission    [redacted]w[redacted]d DNP, CNM  09/27/20  9:30 AM

## 2020-09-27 NOTE — Anesthesia Procedure Notes (Signed)
Epidural Patient location during procedure: OB Start time: 09/27/2020 11:20 AM End time: 09/27/2020 11:30 AM  Staffing Anesthesiologist: Atilano Median, DO Performed: anesthesiologist   Preanesthetic Checklist Completed: patient identified, IV checked, site marked, risks and benefits discussed, surgical consent, monitors and equipment checked, pre-op evaluation and timeout performed  Epidural Patient position: sitting Prep: ChloraPrep Patient monitoring: heart rate, continuous pulse ox and blood pressure Approach: midline Location: L3-L4 Injection technique: LOR saline  Needle:  Needle type: Tuohy  Needle gauge: 17 G Needle length: 9 cm Needle insertion depth: 5.5 cm Catheter type: closed end flexible Catheter size: 20 Guage Catheter at skin depth: 11 cm Test dose: negative and 1.5% lidocaine  Assessment Sensory level: T8 Events: blood not aspirated, injection not painful, no injection resistance and no paresthesia  Additional Notes LOR @ 5.5cm  Patient identified. Risks/Benefits/Options discussed with patient including but not limited to bleeding, infection, nerve damage, paralysis, failed block, incomplete pain control, headache, blood pressure changes, nausea, vomiting, reactions to medications, itching and postpartum back pain. Confirmed with bedside nurse the patient's most recent platelet count. Confirmed with patient that they are not currently taking any anticoagulation, have any bleeding history or any family history of bleeding disorders. Patient expressed understanding and wished to proceed. All questions were answered. Sterile technique was used throughout the entire procedure. Please see nursing notes for vital signs. Test dose was given through epidural catheter and negative prior to continuing to dose epidural or start infusion. Warning signs of high block given to the patient including shortness of breath, tingling/numbness in hands, complete motor block, or  any concerning symptoms with instructions to call for help. Patient was given instructions on fall risk and not to get out of bed. All questions and concerns addressed with instructions to call with any issues or inadequate analgesia.    Reason for block:procedure for pain

## 2020-09-27 NOTE — MAU Note (Signed)
Presents with c/o LOF since 0700 this morning, reports clear fluid.  Also states having ctxs, ctxs began prior to LOF.  Denies VB.  Reports no FM this morning.

## 2020-09-27 NOTE — H&P (Addendum)
OBSTETRIC ADMISSION HISTORY AND PHYSICAL  Heidi Rocha is a 29 y.o. female G2P1001 with IUP at 78w2dby LMP presenting for SOL including SROM of unknown time, possible Tues 09/25/20. Tuesday morning noticed a mucus plug and since that time has had a continuous slow, low volume flow that is non-painful and clear.She reports +FMs, No LOF, no VB.  She plans on breast and bottle feeding. She is undecided for birth control. [place] [facility] --->  Estimated Date of Delivery: 10/02/20  Sono:  _0 , CWD, normal anatomy, breech presentation, anterior placenta, 439g, 64% EFW   Prenatal History/Complications:  GBS (+) Anemia in third trimester, on PO iron  Past Medical History: Past Medical History:  Diagnosis Date  . Medical history non-contributory     Past Surgical History: Past Surgical History:  Procedure Laterality Date  . NO PAST SURGERIES      Obstetrical History: OB History    Gravida  2   Para  1   Term  1   Preterm      AB      Living  1     SAB      TAB      Ectopic      Multiple  0   Live Births  1           Social History Social History   Socioeconomic History  . Marital status: Single    Spouse name: Not on file  . Number of children: Not on file  . Years of education: Not on file  . Highest education level: Not on file  Occupational History  . Not on file  Tobacco Use  . Smoking status: Never Smoker  . Smokeless tobacco: Never Used  Vaping Use  . Vaping Use: Never used  Substance and Sexual Activity  . Alcohol use: No  . Drug use: No  . Sexual activity: Yes    Partners: Male    Birth control/protection: None  Other Topics Concern  . Not on file  Social History Narrative  . Not on file   Social Determinants of Health   Financial Resource Strain:   . Difficulty of Paying Living Expenses: Not on file  Food Insecurity:   . Worried About RCharity fundraiserin the Last Year: Not on  file  . Ran Out of Food in the Last Year: Not on file  Transportation Needs:   . Lack of Transportation (Medical): Not on file  . Lack of Transportation (Non-Medical): Not on file  Physical Activity:   . Days of Exercise per Week: Not on file  . Minutes of Exercise per Session: Not on file  Stress:   . Feeling of Stress : Not on file  Social Connections:   . Frequency of Communication with Friends and Family: Not on file  . Frequency of Social Gatherings with Friends and Family: Not on file  . Attends Religious Services: Not on file  . Active Member of Clubs or Organizations: Not on file  . Attends CArchivistMeetings: Not on file  . Marital Status: Not on file    Family History: History reviewed. No pertinent family history.  Allergies: Allergies  Allergen Reactions  . Tuberculin Tests     Medications Prior to Admission  Medication Sig Dispense Refill Last Dose  . Blood Pressure Monitoring (BLOOD PRESSURE KIT) DEVI 1 Device by Does not apply route as needed. Monitor BP reading  at home regularly 1 each 0   . ciclopirox (LOPROX) 0.77 % cream Apply topically 2 (two) times daily. (Patient not taking: Reported on 08/16/2020) 15 g 0   . ferrous sulfate 325 (65 FE) MG EC tablet Take 1 tablet (325 mg total) by mouth daily with breakfast. 90 tablet 0   . fluticasone (FLONASE) 50 MCG/ACT nasal spray fluticasone propionate 50 mcg/actuation nasal spray,suspension  USE 1 PUFF IN EACH NOSTRIL TWICE A DAY (Patient not taking: Reported on 08/16/2020)     . loratadine (CLARITIN) 10 MG tablet Take 1 tablet (10 mg total) by mouth daily. (Patient not taking: Reported on 08/16/2020) 30 tablet 11   . Misc. Devices MISC Dispense one maternity belt for patient (Patient not taking: Reported on 08/16/2020) 1 each 0   . Prenatal Vit-Fe Phos-FA-Omega (VITAFOL GUMMIES) 3.33-0.333-34.8 MG CHEW Chew 3 each by mouth daily. 90 tablet 11      Review of Systems   All systems reviewed and negative except  as stated in HPI  Blood pressure 129/86, pulse (!) 117, temperature 99.3 F (37.4 C), temperature source Oral, resp. rate 20, height _0  (1.626 m), weight 94 kg, last menstrual period 12/27/2019, SpO2 100 %, currently breastfeeding. General appearance: alert and mild distress Lungs: clear to auscultation bilaterally Heart: regular rate and rhythm Abdomen: soft, non-tender; bowel sounds normal Presentation: cephalic Fetal monitoringBaseline: 155 bpm, Variability: Fair (1-6 bpm), Accelerations: 10x10 present and Decelerations: Variable: mild, one late decel with quick return to baseline Uterine activityFrequency: Every 1-2 minutes Dilation: 3.5 Effacement (%): 80 Station: -3 Exam by:: Mumaw   Prenatal labs: ABO, Rh: --/--/O POS (10/21 6222) Antibody: NEG (10/21 9798) Rubella: 3.77 (04/08 1149) RPR: Non Reactive (08/03 1004)  HBsAg: Negative (04/08 1149)  HIV: Non Reactive (08/03 1004)  GBS: Positive/-- (09/28 0348)  1 hr Glucola passed, 2 hour glucose 86 Genetic screening  AFP negative, Horizon negative Anatomy US normal  Prenatal Transfer Tool  Maternal Diabetes: No Genetic Screening: Normal Maternal Ultrasounds/Referrals: Normal Fetal Ultrasounds or other Referrals:  None Maternal Substance Abuse:  No Significant Maternal Medications:  None Significant Maternal Lab Results: Group B Strep positive  Results for orders placed or performed during the hospital encounter of 09/27/20 (from the past 24 hour(s))  Amnisure rupture of membrane (rom)not at [place] Time: 09/27/20  9:27 AM  Result Value Ref Range   Amnisure ROM NEGATIVE   Respiratory Panel by RT PCR (Flu A&B, Covid) - Nasopharyngeal Swab   Collection Time: 09/27/20  9:54 AM   Specimen: Nasopharyngeal Swab  Result Value Ref Range   SARS Coronavirus 2 by RT PCR NEGATIVE NEGATIVE   Influenza A by PCR NEGATIVE NEGATIVE   Influenza B by PCR NEGATIVE NEGATIVE  CBC   Collection Time: 09/27/20  9:58 AM   Result Value Ref Range   WBC 12.8 (H) 4.0 - 10.5 K/uL   RBC 4.04 3.87 - 5.11 MIL/uL   Hemoglobin 9.7 (L) 12.0 - 15.0 g/dL   HCT 32.1 (L) 36 - 46 %   MCV 79.5 (L) 80.0 - 100.0 fL   MCH 24.0 (L) 26.0 - 34.0 pg   MCHC 30.2 30.0 - 36.0 g/dL   RDW 17.5 (H) 11.5 - 15.5 %   Platelets 246 150 - 400 K/uL   nRBC 0.2 0.0 - 0.2 %  Type and screen   Collection Time: 09/27/20  9:58 AM  Result Value Ref Range   ABO/RH(D) O POS    Antibody Screen NEG  Sample Expiration      09/30/2020,2359 Performed at [facility], [place] [address] [place], [place] [address]     Patient Active Problem List   Diagnosis Date Noted  . Encounter for induction of labor 09/27/2020  . PROM (premature rupture of membranes) 09/27/2020  . GBS (group B Streptococcus carrier), +RV culture, currently pregnant 09/07/2020  . Supervision of other normal pregnancy, antepartum 02/29/2020    Assessment/Plan:  Heidi Rocha is a 29 y.o. G2P1001 at 29w2dhere for SOL with SROM possibly on Tues 10/19 (~2 days ago).   #Labor: Progressing well, expectant management, 1L LR bolus in progress #Pain: epidural #FWB: Category 2, minimal variability, 1 late decel with quick return to baseline  #ID:  GBS (+), PCN ordered #MOF: both #MOC: Undecided #Circ:  N/a #Anemia in third trimester: Hgb 9.3 (8/3) > on admission 9.7  NFulton Reek Medical Student  09/27/2020, 11:54 AM  I was personally present and performed or re-performed the history, physical exam and medical decision making activities of this service and have verified that the service and findings are accurately documented in the student's note.  CEzequiel Essex MD                  09/27/2020, 2:35 PM  Attestation of Attending Supervision of Obstetric Fellow: Evaluation and management procedures were performed by the Obstetric Fellow under my supervision and collaboration.  I have reviewed the Obstetric Fellow's note and chart, and I agree with the  management and plan. I have also made any necessary editorial changes.   LGriffin Basil MD Attending OM[place] [facility] Center[facility] CNellieGroup 09/27/2020 2:53 PM

## 2020-09-27 NOTE — Anesthesia Preprocedure Evaluation (Signed)
Anesthesia Evaluation  Patient identified by MRN, date of birth, ID band Patient awake    Reviewed: Patient's Chart, lab work & pertinent test results  Airway Mallampati: II  TM Distance: >3 FB Neck ROM: Full    Dental  (+) Teeth Intact   Pulmonary neg pulmonary ROS,    Pulmonary exam normal        Cardiovascular negative cardio ROS   Rhythm:Regular Rate:Normal     Neuro/Psych negative neurological ROS  negative psych ROS   GI/Hepatic negative GI ROS, Neg liver ROS,   Endo/Other  negative endocrine ROS  Renal/GU negative Renal ROS  negative genitourinary   Musculoskeletal negative musculoskeletal ROS (+)   Abdominal (+)  Abdomen: soft. Bowel sounds: normal.  Peds  Hematology negative hematology ROS (+)   Anesthesia Other Findings   Reproductive/Obstetrics (+) Pregnancy                             Anesthesia Physical Anesthesia Plan  ASA: II  Anesthesia Plan: Epidural   Post-op Pain Management:    Induction:   PONV Risk Score and Plan: 2  Airway Management Planned:   Additional Equipment: None  Intra-op Plan:   Post-operative Plan:   Informed Consent: I have reviewed the patients History and Physical, chart, labs and discussed the procedure including the risks, benefits and alternatives for the proposed anesthesia with the patient or authorized representative who has indicated his/her understanding and acceptance.     Dental advisory given  Plan Discussed with:   Anesthesia Plan Comments: (Lab Results      Component                Value               Date                      WBC                      12.8 (H)            09/27/2020                HGB                      9.7 (L)             09/27/2020                HCT                      32.1 (L)            09/27/2020                MCV                      79.5 (L)            09/27/2020                PLT                       246                 09/27/2020          )        Anesthesia Quick Evaluation

## 2020-09-27 NOTE — MAU Note (Signed)
Patient states that she had a gush of fluid approx. 0630 with clear fluid noted. Patient denies VB, endorses FM. Patient continues to have LOF after 0630.

## 2020-09-27 NOTE — Discharge Summary (Signed)
Postpartum Discharge Summary     Patient Name: Heidi Rocha DOB: March 01, 1991 MRN: [id number]  Date of admission: 09/27/2020 Delivery date:09/27/2020  Delivering provider: Darrow Bussing D  Date of discharge: 09/29/2020  Admitting diagnosis: Encounter for induction of labor [Z34.90] PROM (premature rupture of membranes) [O42.90] Intrauterine pregnancy: [redacted]w[redacted]d    Secondary diagnosis:  Principal Problem:   Vaginal delivery Active Problems:   Encounter for induction of labor   PROM (premature rupture of membranes)  Additional problems: none   Discharge diagnosis: Term Vaginal Delivery                                           Post partum procedures: none Augmentation: N/A Complications: None  Hospital course: Onset of Labor With Vaginal Delivery      29y.o. yo [id number] 380w2das admitted in Latent Labor on 09/27/2020. Patient had an uncomplicated labor course as follows:  Membrane Rupture Time/Date: 4:00 PM ,09/25/2020   Delivery Method:Vaginal, Spontaneous  Episiotomy: None  Lacerations:  None  Patient had an uncomplicated postpartum course.  She is ambulating, tolerating a regular diet, passing flatus, and urinating well. Patient is discharged home in stable condition on 09/29/20.  Newborn Data: Birth date:09/27/2020  Birth time:2:08 PM  Gender:Female  Living status:Living  Apgars:8 ,2  Weight:7 lb 0.9 oz (3.2 kg)   Magnesium Sulfate received: No BMZ received: No Rhophylac:No MMR:N/A T-DaP: declined Flu: No Transfusion:No  Physical exam  Vitals:   09/28/20 0013 09/28/20 0504 09/28/20 1401 09/29/20 0528  BP: 113/62 108/79 116/69 125/72  Pulse: 95 86 82 71  Resp: 18 18 16 18   Temp: 98.5 F (36.9 C) 98.4 F (36.9 C) 98.4 F (36.9 C) 98.4 F (36.9 C)  TempSrc: Oral Oral Oral Oral  SpO2: 98% 100%  99%  Weight:      Height:       General: alert, cooperative and no distress Lochia: appropriate Uterine Fundus: firm Incision: N/A DVT Evaluation:  No evidence of DVT seen on physical exam. No cords or calf tenderness. No significant calf/ankle edema. Labs: Lab Results  Component Value Date   WBC 25.1 (H) 09/28/2020   HGB 9.6 (L) 09/28/2020   HCT 31.6 (L) 09/28/2020   MCV 78.4 (L) 09/28/2020   PLT 265 09/28/2020   No flowsheet data found. Edinburgh Score: Edinburgh Postnatal Depression Scale Screening Tool 09/28/2020  I have been able to laugh and see the funny side of things. 0  I have looked forward with enjoyment to things. 0  I have blamed myself unnecessarily when things went wrong. 0  I have been anxious or worried for no good reason. 0  I have felt scared or panicky for no good reason. 0  Things have been getting on top of me. 0  I have been so unhappy that I have had difficulty sleeping. 0  I have felt sad or miserable. 0  I have been so unhappy that I have been crying. 0  The thought of harming myself has occurred to me. 0  Edinburgh Postnatal Depression Scale Total 0     After visit meds:  Allergies as of 09/29/2020       Reactions   Tuberculin Tests         Medication List     STOP taking these medications    Blood Pressure Kit Devi   ciclopirox  0.77 % cream Commonly known as: LOPROX   fluticasone 50 MCG/ACT nasal spray Commonly known as: FLONASE   loratadine 10 MG tablet Commonly known as: Claritin   Misc. Devices Misc       TAKE these medications    acetaminophen 325 MG tablet Commonly known as: Tylenol Take 2 tablets (650 mg total) by mouth every 6 (six) hours as needed for mild pain, moderate pain, fever or headache (for pain scale < 4).   coconut oil Oil Apply 1 application topically as needed (nipple pain).   ferrous sulfate 325 (65 FE) MG EC tablet Take 1 tablet (325 mg total) by mouth every other day. What changed: when to take this   ibuprofen 100 MG/5ML suspension Commonly known as: ADVIL Take 30 mLs (600 mg total) by mouth every 8 (eight) hours as needed for mild  pain or moderate pain.   norethindrone 0.35 MG tablet Commonly known as: Ortho Micronor Take 1 tablet (0.35 mg total) by mouth daily.   Vitafol Gummies 3.33-0.333-34.8 MG Chew Chew 3 each by mouth daily.         Discharge home in stable condition Infant Feeding: No evidence of DVT seen on physical exam. No cords or calf tenderness. No significant calf/ankle edema. Infant Disposition:home with mother Discharge instruction: per After Visit Summary and Postpartum booklet. Activity: Advance as tolerated. Pelvic rest for 6 weeks.  Diet: routine diet Future Appointments: Future Appointments  Date Time Provider [place]  10/25/2020  3:20 PM Gavin Pound, CNM [place] None   Follow up Visit:  Message sent to [facility] clinic on 09/27/20 to schedule PP appt.  Please schedule this patient for a In person postpartum visit in 6 weeks with the following provider: Any provider. Additional Postpartum F/U: n/a   Low risk pregnancy complicated by:  none Delivery mode:  Vaginal, Spontaneous  Anticipated Birth Control:  POPs  09/29/2020 Randa Ngo, MD

## 2020-09-27 NOTE — Anesthesia Procedure Notes (Signed)
Epidural Patient location during procedure: OB Start time: 09/27/2020 1:30 PM End time: 09/27/2020 1:45 PM  Staffing Anesthesiologist: Atilano Median, DO Performed: anesthesiologist   Preanesthetic Checklist Completed: patient identified, IV checked, site marked, risks and benefits discussed, surgical consent, monitors and equipment checked, pre-op evaluation and timeout performed  Epidural Patient position: sitting Prep: ChloraPrep Patient monitoring: heart rate, continuous pulse ox and blood pressure Approach: midline Location: L2-L3 Injection technique: LOR saline  Needle:  Needle type: Tuohy  Needle gauge: 17 G Needle length: 9 cm Needle insertion depth: 8 cm Catheter type: closed end flexible Catheter size: 20 Guage Catheter at skin depth: 13 cm Test dose: negative and 1.5% lidocaine  Assessment Sensory level: T8 Events: blood not aspirated, injection not painful, no injection resistance and no paresthesia  Additional Notes LOR @ 8cm. NOTE: Epidural replaced at 1 level higher as patient not receiving appropriate relief on infusion or with physician bolus. Tolerated procedure well without complications.   Patient identified. Risks/Benefits/Options discussed with patient including but not limited to bleeding, infection, nerve damage, paralysis, failed block, incomplete pain control, headache, blood pressure changes, nausea, vomiting, reactions to medications, itching and postpartum back pain. Confirmed with bedside nurse the patient's most recent platelet count. Confirmed with patient that they are not currently taking any anticoagulation, have any bleeding history or any family history of bleeding disorders. Patient expressed understanding and wished to proceed. All questions were answered. Sterile technique was used throughout the entire procedure. Please see nursing notes for vital signs. Test dose was given through epidural catheter and negative prior to continuing  to dose epidural or start infusion. Warning signs of high block given to the patient including shortness of breath, tingling/numbness in hands, complete motor block, or any concerning symptoms with instructions to call for help. Patient was given instructions on fall risk and not to get out of bed. All questions and concerns addressed with instructions to call with any issues or inadequate analgesia.    Reason for block:procedure for pain

## 2020-09-28 LAB — CBC
HCT: 31.6 % — ABNORMAL LOW (ref 36.0–46.0)
Hemoglobin: 9.6 g/dL — ABNORMAL LOW (ref 12.0–15.0)
MCH: 23.8 pg — ABNORMAL LOW (ref 26.0–34.0)
MCHC: 30.4 g/dL (ref 30.0–36.0)
MCV: 78.4 fL — ABNORMAL LOW (ref 80.0–100.0)
Platelets: 265 10*3/uL (ref 150–400)
RBC: 4.03 MIL/uL (ref 3.87–5.11)
RDW: 17.8 % — ABNORMAL HIGH (ref 11.5–15.5)
WBC: 25.1 10*3/uL — ABNORMAL HIGH (ref 4.0–10.5)
nRBC: 0.1 % (ref 0.0–0.2)

## 2020-09-28 LAB — RPR: RPR Ser Ql: NONREACTIVE

## 2020-09-28 MED ORDER — COMPLETENATE 29-1 MG PO CHEW
1.0000 | CHEWABLE_TABLET | Freq: Every day | ORAL | Status: DC
  Administered 2020-09-29: 1 via ORAL
  Filled 2020-09-28: qty 1

## 2020-09-28 NOTE — Anesthesia Postprocedure Evaluation (Signed)
Anesthesia Post Note  Patient: Heidi Rocha  Procedure(s) Performed: AN AD HOC LABOR EPIDURAL     Patient location during evaluation: Mother Baby Anesthesia Type: Epidural Level of consciousness: awake and alert and oriented Pain management: satisfactory to patient Vital Signs Assessment: post-procedure vital signs reviewed and stable Respiratory status: respiratory function stable Cardiovascular status: stable Postop Assessment: no headache, no backache, epidural receding, patient able to bend at knees, no signs of nausea or vomiting, adequate PO intake and able to ambulate Anesthetic complications: no   No complications documented.  Last Vitals:  Vitals:   09/28/20 0013 09/28/20 0504  BP: 113/62 108/79  Pulse: 95 86  Resp: 18 18  Temp: 36.9 C 36.9 C  SpO2: 98% 100%    Last Pain:  Vitals:   09/28/20 0504  TempSrc: Oral  PainSc: 0-No pain   Pain Goal:                   [name]

## 2020-09-28 NOTE — Lactation Note (Signed)
This note was copied from a baby's chart. Lactation Consultation Note  Patient Name: Heidi Rocha Date: 09/28/2020 Reason for consult: Initial assessment;NICU baby;Term   Code Apgar at 5 mins of age R/O sepsis in NICU  LC in to visit with P2 Mom of 31 hr old term baby in the NICU.  Baby is on room air.  (history of pumping and bottle for 3 months with first baby who is 3)  Mom desires to latch baby to breast with this baby.  Baby is currently being formula fed by bottle.  DEBP set up at bedside and Mom states she has pumped a couple times.  Reviewed breast massage and hand expression, colostrum containers provided.  Mom to ask NICU RN for breast milk labels. Mom encouraged to pump both breasts on initiation setting at least 8 times/24 hrs.  Encouraged Mom to hand express prior to and after pumping to collect colostrum for baby.  Encouraged STS with baby as allowed in NICU.  Mom to ask for assistance with breastfeeding in NICU.   Mom aware of IP and OP lactation support and encouraged to call prn.  Lactation brochure provided to Mom.    Mom currently on [facility], faxed [facility] referral for pump on discharge.  Mom aware of DEBP available in baby's room.  Mom instructed on pump parts cleaning, disassembling, washing, rinsing and air drying along with using the Sanitizer Spray once per 24 hrs.     Interventions Interventions: Breast feeding basics reviewed;Skin to skin;Breast massage;Hand express;DEBP  Lactation Tools Discussed/Used Tools: Pump;Flanges Flange Size: 24 Breast pump type: Double-Electric Breast Pump WIC Program: Yes Pump Review: Setup, frequency, and cleaning;Milk Storage Initiated by:: RN Date initiated:: 09/28/20   Consult Status Consult Status: Follow-up Date: 09/29/20 Follow-up type: In-patient    [name] 09/28/2020, 9:36 AM

## 2020-09-28 NOTE — Progress Notes (Signed)
Patient screened out for psychosocial assessment since none of the following apply: °Psychosocial stressors documented in mother or baby's chart °Gestation less than 32 weeks °Code at delivery  °Infant with anomalies °Please contact the Clinical Social Worker if specific needs arise, by MOB's request, or if MOB scores greater than 9/yes to question 10 on Edinburgh Postpartum Depression Screen. ° °[place] Boyd-Gilyard, MSW, LCSW °Clinical Social Work °[phone] °  °

## 2020-09-28 NOTE — Progress Notes (Addendum)
POSTPARTUM PROGRESS NOTE  Post Partum Day 1  Subjective:  Heidi Rocha is a 29 y.o. [phone] s/p spontaneous vaginal delivery at [redacted]w[redacted]d  No acute events overnight.  Pt denies problems with ambulating, voiding or po intake.  She denies nausea or vomiting.  Pain is well controlled.  She has had flatus. She has had bowel movement.  Lochia Small.   Objective: Blood pressure 108/79, pulse 86, temperature 98.4 F (36.9 C), temperature source Oral, resp. rate 18, height 5' 4"  (1.626 m), weight 94 kg, last menstrual period 12/27/2019, SpO2 100 %, unknown if currently breastfeeding.  Physical Exam:  General: alert, cooperative and no distress Chest: no respiratory distress Heart:regular rate, distal pulses intact Abdomen: soft, nontender,  Uterine Fundus: firm, appropriately tender DVT Evaluation: No calf swelling or tenderness Extremities: No edema Skin: warm, dry and intact  Recent Labs    09/27/20 0958 09/28/20 0530  HGB 9.7* 9.6*  HCT 32.1* 31.6*    Assessment/Plan: Heidi AHARTis a 29y.o. [id number]/p SVD at 364w2d PPD#1 - Doing well Contraception: Oral Contraceptive--will Rx POPs at DC Anemia of pregnancy:  continue oral FeSO4 until PP visit Feeding: Bottle and breast Dispo: Plan for discharge tomorrow.   LOS: 1 day   Heidi LauberMedical Student, 09/28/2020, 7:28 AM    I personally saw and evaluated the patient, performing the key elements of the service. I developed and verified the management plan that is described in the resident's/student's note, and I agree with the content with my edits above. VSS, HRR&R, Resp unlabored, Legs neg.  FrNigel BertholdCNM 09/30/2020 11:00 AM

## 2020-09-29 MED ORDER — COCONUT OIL OIL: 1.0000 "application " | TOPICAL_OIL | 0 refills | Status: AC | PRN

## 2020-09-29 MED ORDER — IBUPROFEN 100 MG/5ML PO SUSP: 600.0000 mg | Freq: Three times a day (TID) | ORAL | 0 refills | Status: DC | PRN

## 2020-09-29 MED ORDER — ACETAMINOPHEN 325 MG PO TABS: 650.0000 mg | ORAL_TABLET | Freq: Four times a day (QID) | ORAL | Status: AC | PRN

## 2020-09-29 MED ORDER — NORETHINDRONE 0.35 MG PO TABS: 1.0000 | ORAL_TABLET | Freq: Every day | ORAL | 11 refills | Status: DC

## 2020-09-29 MED ORDER — FERROUS SULFATE 325 (65 FE) MG PO TBEC: 325.0000 mg | DELAYED_RELEASE_TABLET | ORAL | 0 refills | Status: AC

## 2020-09-29 NOTE — Plan of Care (Signed)
Discharge teaching and after visit summary given, Pt receptive

## 2020-10-01 LAB — SURGICAL PATHOLOGY

## 2020-10-02 ENCOUNTER — Encounter: Payer: Medicaid Other | Admitting: Obstetrics and Gynecology

## 2020-10-03 ENCOUNTER — Telehealth: Payer: Self-pay | Admitting: Obstetrics and Gynecology

## 2020-10-03 MED ORDER — IBUPROFEN 800 MG PO TABS: 800.0000 mg | ORAL_TABLET | Freq: Three times a day (TID) | ORAL | 0 refills | Status: AC | PRN

## 2020-10-03 NOTE — Telephone Encounter (Signed)
New script for po ibuprofen (pt was prescribed ibuprofen as suspension in error--changed to po tabs).

## 2020-10-09 ENCOUNTER — Inpatient Hospital Stay (HOSPITAL_COMMUNITY)
Admission: AD | Admit: 2020-10-09 | Payer: Medicaid Other | Source: Home / Self Care | Admitting: Obstetrics & Gynecology

## 2020-10-09 ENCOUNTER — Inpatient Hospital Stay (HOSPITAL_COMMUNITY): Payer: Medicaid Other

## 2020-10-25 ENCOUNTER — Other Ambulatory Visit: Payer: Self-pay

## 2020-10-25 ENCOUNTER — Ambulatory Visit (INDEPENDENT_AMBULATORY_CARE_PROVIDER_SITE_OTHER): Payer: Medicaid Other

## 2020-10-25 DIAGNOSIS — Z30011 Encounter for initial prescription of contraceptive pills: Secondary | ICD-10-CM

## 2020-10-25 NOTE — Patient Instructions (Signed)
Norethindrone tablets (contraception) What is this medicine? NORETHINDRONE (nor eth IN drone) is an oral contraceptive. The product contains a female hormone known as a progestin. It is used to prevent pregnancy. This medicine may be used for other purposes; ask your health care provider or pharmacist if you have questions. COMMON BRAND NAME(S): Camila, Deblitane 28-Day, Errin, Heather, Jencycla, Jolivette, Lyza, Nor-QD, Nora-BE, Norlyroc, Ortho Micronor, Sharobel 28-Day What should I tell my health care provider before I take this medicine? They need to know if you have any of these conditions:  blood vessel disease or blood clots  breast, cervical, or vaginal cancer  diabetes  heart disease  kidney disease  liver disease  mental depression  migraine  seizures  stroke  vaginal bleeding  an unusual or allergic reaction to norethindrone, other medicines, foods, dyes, or preservatives  pregnant or trying to get pregnant  breast-feeding How should I use this medicine? Take this medicine by mouth with a glass of water. You may take it with or without food. Follow the directions on the prescription label. Take this medicine at the same time each day and in the order directed on the package. Do not take your medicine more often than directed. Contact your pediatrician regarding the use of this medicine in children. Special care may be needed. This medicine has been used in female children who have started having menstrual periods. A patient package insert for the product will be given with each prescription and refill. Read this sheet carefully each time. The sheet may change frequently. Overdosage: If you think you have taken too much of this medicine contact a poison control center or emergency room at once. NOTE: This medicine is only for you. Do not share this medicine with others. What if I miss a dose? Try not to miss a dose. Every time you miss a dose or take a dose late  your chance of pregnancy increases. When 1 pill is missed (even if only 3 hours late), take the missed pill as soon as possible and continue taking a pill each day at the regular time (use a back up method of birth control for the next 48 hours). If more than 1 dose is missed, use an additional birth control method for the rest of your pill pack until menses occurs. Contact your health care professional if more than 1 dose has been missed. What may interact with this medicine? Do not take this medicine with any of the following medications:  amprenavir or fosamprenavir  bosentan This medicine may also interact with the following medications:  antibiotics or medicines for infections, especially rifampin, rifabutin, rifapentine, and griseofulvin, and possibly penicillins or tetracyclines  aprepitant  barbiturate medicines, such as phenobarbital  carbamazepine  felbamate  modafinil  oxcarbazepine  phenytoin  ritonavir or other medicines for HIV infection or AIDS  St. John's wort  topiramate This list may not describe all possible interactions. Give your health care provider a list of all the medicines, herbs, non-prescription drugs, or dietary supplements you use. Also tell them if you smoke, drink alcohol, or use illegal drugs. Some items may interact with your medicine. What should I watch for while using this medicine? Visit your doctor or health care professional for regular checks on your progress. You will need a regular breast and pelvic exam and Pap smear while on this medicine. Use an additional method of birth control during the first cycle that you take these tablets. If you have any reason to think you   are pregnant, stop taking this medicine right away and contact your doctor or health care professional. If you are taking this medicine for hormone related problems, it may take several cycles of use to see improvement in your condition. This medicine does not protect you  against HIV infection (AIDS) or any other sexually transmitted diseases. What side effects may I notice from receiving this medicine? Side effects that you should report to your doctor or health care professional as soon as possible:  breast tenderness or discharge  pain in the abdomen, chest, groin or leg  severe headache  skin rash, itching, or hives  sudden shortness of breath  unusually weak or tired  vision or speech problems  yellowing of skin or eyes Side effects that usually do not require medical attention (report to your doctor or health care professional if they continue or are bothersome):  changes in sexual desire  change in menstrual flow  facial hair growth  fluid retention and swelling  headache  irritability  nausea  weight gain or loss This list may not describe all possible side effects. Call your doctor for medical advice about side effects. You may report side effects to FDA at 1-800-FDA-1088. Where should I keep my medicine? Keep out of the reach of children. Store at room temperature between 15 and 30 degrees C (59 and 86 degrees F). Throw away any unused medicine after the expiration date. NOTE: This sheet is a summary. It may not cover all possible information. If you have questions about this medicine, talk to your doctor, pharmacist, or health care provider.  2020 Elsevier/Gold Standard (2012-08-13 16:41:35)  

## 2020-10-25 NOTE — Progress Notes (Signed)
Post Partum Visit Note  Heidi Rocha is a 29 y.o. G64P2002 female who presents for a postpartum visit. She is 4 weeks postpartum following a normal spontaneous vaginal delivery.  I have fully reviewed the prenatal and intrapartum course. The delivery was at 39.2 gestational weeks.  Anesthesia: epidural. Postpartum course has been doing well. Baby is doing well. Baby is feeding by both breast and bottle - Gerber Smooth. Bleeding no bleeding. Bowel function is normal. Bladder function is normal. Patient is not sexually active. Contraception method is abstinence, interested in Pill.  Postpartum depression screening: negative, score 0.  Patient reports she received support from her boyfriend/FOB.   The pregnancy intention screening data noted above was reviewed. Potential methods of contraception were discussed. The patient elected to proceed with Oral Contraceptive.    Edinburgh Postnatal Depression Scale - 10/25/20 1518      Edinburgh Postnatal Depression Scale:  In the Past 7 Days   I have been able to laugh and see the funny side of things. 0    I have looked forward with enjoyment to things. 0    I have blamed myself unnecessarily when things went wrong. 0    I have been anxious or worried for no good reason. 0    I have felt scared or panicky for no good reason. 0    Things have been getting on top of me. 0    I have been so unhappy that I have had difficulty sleeping. 0    I have felt sad or miserable. 0    I have been so unhappy that I have been crying. 0    The thought of harming myself has occurred to me. 0    Edinburgh Postnatal Depression Scale Total 0            The following portions of the patient's history were reviewed and updated as appropriate: allergies, current medications, past family history, past medical history, past social history, past surgical history and problem list.  Review of Systems Pertinent items noted in HPI and remainder of comprehensive ROS  otherwise negative.    Objective:  BP 106/66   Pulse 67   Wt 183 lb (83 kg)   LMP 12/27/2019   BMI 31.41 kg/m   BP 106/66   Pulse 67   Wt 183 lb (83 kg)   LMP 12/27/2019   BMI 31.41 kg/m    General:  alert, cooperative and no distress   Breasts:  Not examined  Lungs: clear to auscultation bilaterally  Heart:  regular rate and rhythm  Abdomen: soft, non-tender; bowel sounds normal; no masses,  no organomegaly   Vulva:  not evaluated  Vagina: not evaluated  Cervix:  Not Evaluated  Corpus: not examined  Adnexa:  not evaluated  Rectal Exam: Not performed.        Assessment:   4 week postpartum exam.  S/P SVD Breast Feeding Desires Oral Contraception Pap smear UTD  Plan:   -Informed that pills are already available at pharmacy.  Reviewed precautions regarding administration, missed pills, and back-up methods. -Patient okay to RTW.  Given excuse for pumping while at work. -Okay to resume other normal activities including sex when desired.   Essential components of care per ACOG recommendations:  1.  Mood and well being: Patient with negative depression screening today. Reviewed local resources for support.  - Patient does not use tobacco.  - hx of drug use? No   2. Infant care  and feeding:  -Patient currently breastmilk feeding? Yes-pumping If breastmilk feeding discussed return to work and pumping. Patient was provided letter for work to allow for every 2-3 hr pumping breaks, and to be granted a private location to express breastmilk and refrigerated area to store breastmilk. Reviewed importance of draining breast regularly to support lactation. -Social determinants of health (SDOH) reviewed in EPIC. No concerns  3. Sexuality, contraception and birth spacing - Patient does not want a pregnancy in the next year.  Desired family size is unsure children.  - Reviewed forms of contraception in tiered fashion. Patient desired oral progesterone-only contraceptive today.  -Script previously sent by hospital staff.  Patient informed.  - Discussed birth spacing of 18 months  4. Sleep and fatigue -Encouraged family/partner/community support of 4 hrs of uninterrupted sleep to help with mood and fatigue  5. Physical Recovery  - Discussed patients delivery and complications-Patient states "it was good." Patient tells provider birth story of delivering baby unassisted,at hospital, after having feelings of pressure! - Patient had no laceration. - Patient has urinary incontinence? No - Patient is safe to resume physical and sexual activity  6.  Health Maintenance - Last pap smear done April 2019 and was normal with negative HPV. No Mammogram d/t age  66. No Chronic Disease - PCP follow up  Cherre Robins, CNM [facility] [employer], [facility]

## 2020-11-15 ENCOUNTER — Other Ambulatory Visit: Payer: Self-pay

## 2020-11-15 ENCOUNTER — Ambulatory Visit (HOSPITAL_COMMUNITY)
Admission: EM | Admit: 2020-11-15 | Discharge: 2020-11-15 | Disposition: A | Discharge status: Home / Self Care | Payer: Medicaid Other | Attending: Emergency Medicine | Admitting: Emergency Medicine

## 2020-11-15 ENCOUNTER — Encounter (HOSPITAL_COMMUNITY): Payer: Self-pay | Admitting: Emergency Medicine

## 2020-11-15 DIAGNOSIS — B349 Viral infection, unspecified: Secondary | ICD-10-CM

## 2020-11-15 DIAGNOSIS — R6883 Chills (without fever): Secondary | ICD-10-CM

## 2020-11-15 DIAGNOSIS — Z20822 Contact with and (suspected) exposure to covid-19: Secondary | ICD-10-CM | POA: Insufficient documentation

## 2020-11-15 LAB — RESP PANEL BY RT-PCR (FLU A&B, COVID) ARPGX2
Influenza A by PCR: NEGATIVE
Influenza B by PCR: NEGATIVE
SARS Coronavirus 2 by RT PCR: NEGATIVE

## 2020-11-15 LAB — POCT RAPID STREP A, ED / UC: Streptococcus, Group A Screen (Direct): NEGATIVE

## 2020-11-15 MED ORDER — ACETAMINOPHEN 325 MG PO TABS
ORAL_TABLET | ORAL | Status: AC
  Filled 2020-11-15: qty 2

## 2020-11-15 MED ORDER — ACETAMINOPHEN 325 MG PO TABS
650.0000 mg | ORAL_TABLET | Freq: Once | ORAL | Status: AC
  Administered 2020-11-15: 650 mg via ORAL

## 2020-11-15 NOTE — ED Notes (Signed)
Spoke to [employer], np about temp 101

## 2020-11-15 NOTE — Discharge Instructions (Addendum)
Your rapid strep test is negative.  A throat culture is pending; we will call you if it is positive requiring treatment.    Your COVID and Flu tests are pending.  You should self quarantine until the test results are back.    Take Tylenol as needed for fever or discomfort.  Rest and keep yourself hydrated.    Follow up with your primary care provider if your symptoms are not improving.      

## 2020-11-15 NOTE — ED Notes (Signed)
Strep swab in lab nasopharyngeal swab at bedside

## 2020-11-15 NOTE — ED Provider Notes (Signed)
[facility]    CSN: [id number] Arrival date & time: 11/15/20  1433      History   Chief Complaint Chief Complaint  Patient presents with  . Chills    HPI Heidi Rocha is a 29 y.o. female.  Patient presents with fever, chills, headache, body aches x1 day. She also reports a sore throat. She denies rash, cough, shortness of breath, abdominal pain, vomiting, diarrhea, or other symptoms. She is currently breast-feeding her 108-month-old baby; she denies redness or rash on her breasts. She denies pertinent medical history.  The history is provided by the patient.    Past Medical History:  Diagnosis Date  . Medical history non-contributory     There are no problems to display for this patient.   Past Surgical History:  Procedure Laterality Date  . NO PAST SURGERIES      OB History    Gravida  2   Para  2   Term  2   Preterm      AB      Living  2     SAB      IAB      Ectopic      Multiple  0   Live Births  2            Home Medications    Prior to Admission medications   Medication Sig Start Date End Date Taking? Authorizing Provider  norethindrone (ORTHO MICRONOR) 0.35 MG tablet Take 1 tablet (0.35 mg total) by mouth daily. 09/29/20 09/29/21 Yes Goswick, Skipper Cliche, MD  Prenatal Vit-Fe Phos-FA-Omega (VITAFOL GUMMIES) 3.33-0.333-34.8 MG CHEW Chew 3 each by mouth daily. 03/27/20  Yes Adam Phenix, MD  acetaminophen (TYLENOL) 325 MG tablet Take 2 tablets (650 mg total) by mouth every 6 (six) hours as needed for mild pain, moderate pain, fever or headache (for pain scale < 4). 09/29/20   Sheila Oats, MD  coconut oil OIL Apply 1 application topically as needed (nipple pain). 09/29/20   Sheila Oats, MD  ferrous sulfate 325 (65 FE) MG EC tablet Take 1 tablet (325 mg total) by mouth every other day. 09/29/20 12/28/20  Sheila Oats, MD  ibuprofen (ADVIL) 800 MG tablet Take 1 tablet (800 mg total) by mouth every 8 (eight) hours as  needed for headache, mild pain, moderate pain or cramping. 10/03/20   Sheila Oats, MD    Family History History reviewed. No pertinent family history.  Social History Social History   Tobacco Use  . Smoking status: Never Smoker  . Smokeless tobacco: Never Used  Vaping Use  . Vaping Use: Never used  Substance Use Topics  . Alcohol use: No  . Drug use: No     Allergies   Tuberculin tests   Review of Systems Review of Systems  Constitutional: Positive for chills and fever.  HENT: Positive for sore throat. Negative for ear pain.   Eyes: Negative for pain and visual disturbance.  Respiratory: Negative for cough and shortness of breath.   Cardiovascular: Negative for chest pain and palpitations.  Gastrointestinal: Negative for abdominal pain, constipation, diarrhea, nausea and vomiting.  Genitourinary: Negative for dysuria and hematuria.  Musculoskeletal: Negative for arthralgias and back pain.  Skin: Negative for color change and rash.  Neurological: Positive for headaches. Negative for seizures and syncope.  All other systems reviewed and are negative.    Physical Exam Triage Vital Signs ED Triage Vitals  Enc Vitals Group  BP      Pulse      Resp      Temp      Temp src      SpO2      Weight      Height      Head Circumference      Peak Flow      Pain Score      Pain Loc      Pain Edu?      Excl. in GC?    No data found.  Updated Vital Signs BP 116/68 (BP Location: Right Arm)   Pulse (!) 107   Temp (!) 101 F (38.3 C) (Oral)   Resp 16   LMP 11/01/2020   SpO2 98%   Breastfeeding Yes   Visual Acuity Right Eye Distance:   Left Eye Distance:   Bilateral Distance:    Right Eye Near:   Left Eye Near:    Bilateral Near:     Physical Exam Vitals and nursing note reviewed.  Constitutional:      General: She is not in acute distress.    Appearance: She is well-developed and well-nourished. She is not ill-appearing.  HENT:     Head:  Normocephalic and atraumatic.     Right Ear: Tympanic membrane normal.     Left Ear: Tympanic membrane normal.     Nose: Nose normal.     Mouth/Throat:     Mouth: Mucous membranes are moist.     Pharynx: Oropharynx is clear.  Eyes:     Conjunctiva/sclera: Conjunctivae normal.  Cardiovascular:     Rate and Rhythm: Normal rate and regular rhythm.     Heart sounds: Normal heart sounds.  Pulmonary:     Effort: Pulmonary effort is normal. No respiratory distress.     Breath sounds: Normal breath sounds.  Abdominal:     Palpations: Abdomen is soft.     Tenderness: There is no abdominal tenderness. There is no guarding or rebound.  Musculoskeletal:        General: No edema.     Cervical back: Neck supple.  Skin:    General: Skin is warm and dry.     Findings: No rash.  Neurological:     General: No focal deficit present.     Mental Status: She is alert and oriented to person, place, and time.     Gait: Gait normal.  Psychiatric:        Mood and Affect: Mood and affect and mood normal.        Behavior: Behavior normal.      UC Treatments / Results  Labs (all labs ordered are listed, but only abnormal results are displayed) Labs Reviewed  RESP PANEL BY RT-PCR (FLU A&B, COVID) ARPGX2  CULTURE, GROUP A STREP [facility])  POCT RAPID STREP A, ED / UC    EKG   Radiology No results found.  Procedures Procedures (including critical care time)  Medications Ordered in UC Medications  acetaminophen (TYLENOL) tablet 650 mg (650 mg Oral Given 11/15/20 1704)    Initial Impression / Assessment and Plan / UC Course  I have reviewed the triage vital signs and the nursing notes.  Pertinent labs & imaging results that were available during my care of the patient were reviewed by me and considered in my medical decision making (see chart for details).   Viral illness. Rapid strep negative; culture pending.  Influenza and COVID pending.  Instructed patient to self quarantine until the  test results are back.  Discussed symptomatic treatment including Tylenol, rest, hydration.  Instructed patient to follow up with PCP if her symptoms are not improving  Patient agrees to plan of care.    Final Clinical Impressions(s) / UC Diagnoses   Final diagnoses:  Viral illness     Discharge Instructions     Your rapid strep test is negative.  A throat culture is pending; we will call you if it is positive requiring treatment.    Your COVID and Flu tests are pending.  You should self quarantine until the test results are back.    Take Tylenol as needed for fever or discomfort.  Rest and keep yourself hydrated.    Follow-up with your primary care provider if your symptoms are not improving.        ED Prescriptions    None     PDMP not reviewed this encounter.   Mickie Bail, NP 11/15/20 1715

## 2020-11-15 NOTE — ED Triage Notes (Addendum)
Yesterday started having chills.  Patient complains of headache and generalized body aches  Throat hurts with eating  Patient has a one month old-she is breast feeding

## 2020-11-18 LAB — CULTURE, GROUP A STREP (THRC)

## 2021-08-30 ENCOUNTER — Ambulatory Visit
Admission: EM | Admit: 2021-08-30 | Discharge: 2021-08-30 | Disposition: A | Discharge status: Home / Self Care | Payer: Medicaid Other | Attending: Internal Medicine | Admitting: Internal Medicine

## 2021-08-30 ENCOUNTER — Other Ambulatory Visit: Payer: Self-pay

## 2021-08-30 DIAGNOSIS — L2389 Allergic contact dermatitis due to other agents: Secondary | ICD-10-CM | POA: Diagnosis not present

## 2021-08-30 DIAGNOSIS — L509 Urticaria, unspecified: Secondary | ICD-10-CM

## 2021-08-30 MED ORDER — TRIAMCINOLONE ACETONIDE 0.025 % EX OINT: 1.0000 "application " | TOPICAL_OINTMENT | Freq: Two times a day (BID) | CUTANEOUS | 0 refills | Status: AC

## 2021-08-30 NOTE — ED Triage Notes (Signed)
Pt c/o rash to bilat hands onset last wed. Lesions appear to be red macule-like areas dispersed mostly on palms and between fingers. They are itchy and slightly painful. Burns when washing hands with hot water.

## 2021-08-30 NOTE — ED Provider Notes (Signed)
[facility]    CSN: [id number] Arrival date & time: 08/30/21  1341      History   Chief Complaint Chief Complaint  Patient presents with   Rash    HPI Heidi Rocha is a 30 y.o. female.   Patient presents with itchy, red rash to bilateral hands that has been present for approximately 3 days.  Denies any changes to foods, lotions, detergents, soaps, etc.  Has burning sensation when washing hands with hot water.  No other family members or household occupants have similar rash.  Has not yet used any over-the-counter remedies to help alleviate itching or rash.  Denies fever.   Rash  Past Medical History:  Diagnosis Date   Medical history non-contributory     There are no problems to display for this patient.   Past Surgical History:  Procedure Laterality Date   NO PAST SURGERIES      OB History     Gravida  2   Para  2   Term  2   Preterm      AB      Living  2      SAB      IAB      Ectopic      Multiple  0   Live Births  2            Home Medications    Prior to Admission medications   Medication Sig Start Date End Date Taking? Authorizing Provider  triamcinolone (KENALOG) 0.025 % ointment Apply 1 application topically 2 (two) times daily. 08/30/21  Yes Lance Muss, FNP  acetaminophen (TYLENOL) 325 MG tablet Take 2 tablets (650 mg total) by mouth every 6 (six) hours as needed for mild pain, moderate pain, fever or headache (for pain scale < 4). 09/29/20   Sheila Oats, MD  coconut oil OIL Apply 1 application topically as needed (nipple pain). 09/29/20   Sheila Oats, MD  ferrous sulfate 325 (65 FE) MG EC tablet Take 1 tablet (325 mg total) by mouth every other day. 09/29/20 12/28/20  Sheila Oats, MD  ibuprofen (ADVIL) 800 MG tablet Take 1 tablet (800 mg total) by mouth every 8 (eight) hours as needed for headache, mild pain, moderate pain or cramping. 10/03/20   Sheila Oats, MD  norethindrone (ORTHO  MICRONOR) 0.35 MG tablet Take 1 tablet (0.35 mg total) by mouth daily. 09/29/20 09/29/21  Sheila Oats, MD  Prenatal Vit-Fe Phos-FA-Omega (VITAFOL GUMMIES) 3.33-0.333-34.8 MG CHEW Chew 3 each by mouth daily. 03/27/20   Adam Phenix, MD    Family History History reviewed. No pertinent family history.  Social History Social History   Tobacco Use   Smoking status: Never   Smokeless tobacco: Never  Vaping Use   Vaping Use: Never used  Substance Use Topics   Alcohol use: No   Drug use: No     Allergies   Tuberculin tests   Review of Systems Review of Systems Per HPI  Physical Exam Triage Vital Signs ED Triage Vitals  Enc Vitals Group     BP 08/30/21 1506 120/75     Pulse Rate 08/30/21 1506 65     Resp 08/30/21 1506 18     Temp 08/30/21 1506 98 F (36.7 C)     Temp Source 08/30/21 1506 Oral     SpO2 08/30/21 1506 96 %     Weight --      Height --  Head Circumference --      Peak Flow --      Pain Score 08/30/21 1507 0     Pain Loc --      Pain Edu? --      Excl. in GC? --    No data found.  Updated Vital Signs BP 120/75 (BP Location: Left Arm)   Pulse 65   Temp 98 F (36.7 C) (Oral)   Resp 18   SpO2 96%   Breastfeeding Yes   Visual Acuity Right Eye Distance:   Left Eye Distance:   Bilateral Distance:    Right Eye Near:   Left Eye Near:    Bilateral Near:     Physical Exam Constitutional:      Appearance: Normal appearance.  HENT:     Head: Normocephalic and atraumatic.  Eyes:     Extraocular Movements: Extraocular movements intact.     Conjunctiva/sclera: Conjunctivae normal.  Pulmonary:     Effort: Pulmonary effort is normal.  Skin:    General: Skin is warm and dry.     Findings: Rash present. Rash is urticarial.     Comments: Maculopapular rash present to bilateral hands.  No burrows noted.  Neurovascular intact.  No signs of infection.  Neurological:     General: No focal deficit present.     Mental Status: She is alert and  oriented to person, place, and time. Mental status is at baseline.  Psychiatric:        Mood and Affect: Mood normal.        Behavior: Behavior normal.        Thought Content: Thought content normal.        Judgment: Judgment normal.     UC Treatments / Results  Labs (all labs ordered are listed, but only abnormal results are displayed) Labs Reviewed - No data to display  EKG   Radiology No results found.  Procedures Procedures (including critical care time)  Medications Ordered in UC Medications - No data to display  Initial Impression / Assessment and Plan / UC Course  I have reviewed the triage vital signs and the nursing notes.  Pertinent labs & imaging results that were available during my care of the patient were reviewed by me and considered in my medical decision making (see chart for details).     Rash to bilateral hands is most consistent with allergic contact dermatitis.  Will treat with triamcinolone steroid cream.  Advised patient to follow-up if rash persists.Discussed strict return precautions. Patient verbalized understanding and is agreeable with plan.  Final Clinical Impressions(s) / UC Diagnoses   Final diagnoses:  Allergic contact dermatitis due to other agents  Urticaria     Discharge Instructions      You have been prescribed a steroid cream for your hands to help alleviate itching and allergic reaction.  Unable to prescribe itching medication take as needed for itching due to your current breast-feeding.  Although, the cream should help with itching as well.  Please follow-up with primary care or urgent care if rash persist.     ED Prescriptions     Medication Sig Dispense Auth. Provider   triamcinolone (KENALOG) 0.025 % ointment Apply 1 application topically 2 (two) times daily. 30 g Lance Muss, FNP      PDMP not reviewed this encounter.   Lance Muss, FNP 08/30/21 1529

## 2021-08-30 NOTE — Discharge Instructions (Addendum)
You have been prescribed a steroid cream for your hands to help alleviate itching and allergic reaction.  Unable to prescribe itching medication take as needed for itching due to your current breast-feeding.  Although, the cream should help with itching as well.  Please follow-up with primary care or urgent care if rash persist.

## 2024-05-18 ENCOUNTER — Other Ambulatory Visit (HOSPITAL_COMMUNITY)
Admission: RE | Admit: 2024-05-18 | Discharge: 2024-05-18 | Disposition: A | Discharge status: Home / Self Care | Source: Ambulatory Visit | Attending: Obstetrics and Gynecology | Admitting: Obstetrics and Gynecology

## 2024-05-18 ENCOUNTER — Ambulatory Visit: Admitting: Obstetrics and Gynecology

## 2024-05-18 ENCOUNTER — Encounter: Payer: Self-pay | Admitting: Obstetrics and Gynecology

## 2024-05-18 VITALS — BP 114/71 | HR 81 | Ht 63.0 in | Wt 175.0 lb

## 2024-05-18 DIAGNOSIS — Z113 Encounter for screening for infections with a predominantly sexual mode of transmission: Secondary | ICD-10-CM

## 2024-05-18 DIAGNOSIS — Z30011 Encounter for initial prescription of contraceptive pills: Secondary | ICD-10-CM

## 2024-05-18 DIAGNOSIS — Z0000 Encounter for general adult medical examination without abnormal findings: Secondary | ICD-10-CM | POA: Insufficient documentation

## 2024-05-18 DIAGNOSIS — Z758 Other problems related to medical facilities and other health care: Secondary | ICD-10-CM

## 2024-05-18 DIAGNOSIS — Z01419 Encounter for gynecological examination (general) (routine) without abnormal findings: Secondary | ICD-10-CM | POA: Diagnosis not present

## 2024-05-18 LAB — POCT URINE PREGNANCY: Preg Test, Ur: NEGATIVE

## 2024-05-18 MED ORDER — NORETHIN ACE-ETH ESTRAD-FE 1-20 MG-MCG(24) PO TABS: 1.0000 | ORAL_TABLET | Freq: Every day | ORAL | 11 refills | Status: DC

## 2024-05-18 NOTE — Progress Notes (Signed)
 Would like to discuss birth control with provider today. Interested in the pill.

## 2024-05-18 NOTE — Progress Notes (Signed)
 ANNUAL EXAM Patient name: Heidi Rocha MRN [id number]  Date of birth: 1991/07/20 Chief Complaint:   No chief complaint on file.  History of Present Illness:   Heidi Rocha is a 33 y.o. [phone]  female being seen today for a routine annual exam. Denies vaginal or pelvic complaints. Desiring birth control today.   Patient's last menstrual period was 05/09/2024 (exact date).   The pregnancy intention screening data noted above was reviewed. Potential methods of contraception were discussed. The patient elected to proceed with OCP  Gynecologic History Patient's last menstrual period was .05/09/24 Contraception: none currently  Last Pap: 03/23/18 Results were: Normal Last mammogram: .n/a Results were:n/a     05/18/2024    3:15 PM  Depression screen PHQ 2/9  Decreased Interest 0  Down, Depressed, Hopeless 0  PHQ - 2 Score 0  Altered sleeping 0  Tired, decreased energy 0  Change in appetite 0  Feeling bad or failure about yourself  0  Trouble concentrating 0  Moving slowly or fidgety/restless 0  Suicidal thoughts 0  PHQ-9 Score 0        05/18/2024    3:15 PM  GAD 7 : Generalized Anxiety Score  Nervous, Anxious, on Edge 0  Control/stop worrying 0  Worry too much - different things 0  Trouble relaxing 0  Restless 0  Easily annoyed or irritable 0  Afraid - awful might happen 0  Total GAD 7 Score 0     Review of Systems:   Pertinent items are noted in HPI Denies any headaches, blurred vision, fatigue, shortness of breath, chest pain, abdominal pain, abnormal vaginal discharge/itching/odor/irritation, problems with periods, bowel movements, urination, or intercourse unless otherwise stated above. Pertinent History Reviewed:  Reviewed past medical,surgical, social and family history.  Reviewed problem list, medications and allergies. Physical Assessment:   Vitals:   05/18/24 1502  BP: 114/71  Pulse: 81  Weight: 175 lb (79.4 kg)  Height: 5' 3 (1.6 m)  Body  mass index is 31 kg/m.        Physical Examination:   General appearance - well appearing, and in no distress  Mental status - alert, oriented   Psych:  She has a normal mood and affect  Skin - warm and dry, normal color  Chest - effort normal, all lung fields clear to auscultation bilaterally  Heart - normal rate and regular rhythm  Neck:  midline trachea, no thyromegaly   Breasts - breasts appear normal, no suspicious masses, no skin or nipple changes or  axillary nodes  Abdomen - soft, nontender  Pelvic - VULVA: normal appearing vulva with no masses, tenderness or lesions  VAGINA: normal appearing vagina with normal color and discharge, no lesions  CERVIX: normal appearing cervix without discharge or lesions, no CMT  Thin prep pap is done w HR HPV cotesting  UTERUS: uterus is felt to be normal size, shape, consistency and nontender   ADNEXA: No adnexal masses or tenderness noted.  Extremities:  No swelling or varicosities noted  Chaperone present for exam    Assessment & Plan:  1. Encounter for annual routine gynecological examination (Primary) Pap today Encouraged routine self breast exam Does not see PCP, annual blood work today  Referral placed to establish PCP  - Cytology - PAP( [facility]) - CBC - Comp Met (CMET) - TSH - Lipid panel  2. Screening examination for STD (sexually transmitted disease)  - Cervicovaginal ancillary only( ) - HIV antibody (with reflex) -  Hepatitis B Surface AntiGEN - Hepatitis C Antibody - RPR  3. Encounter for initial prescription of contraceptive pills Discussed initiation, side effect, missed pill window UPT neg  - Norethindrone  Acetate-Ethinyl Estrad-FE (LOESTRIN 24 FE) 1-20 MG-MCG(24) tablet; Take 1 tablet by mouth daily.  Dispense: 28 tablet; Refill: 11 - POCT urine pregnancy    Labs/procedures today:   Mammogram: @ 31yo, or sooner if problems Colonoscopy: @ 31yo, or sooner if problems  Orders Placed This  Encounter  Procedures   HIV antibody (with reflex)   Hepatitis B Surface AntiGEN   Hepatitis C Antibody   RPR   CBC   Comp Met (CMET)   TSH   Lipid panel   POCT urine pregnancy    Meds:  Meds ordered this encounter  Medications   Norethindrone  Acetate-Ethinyl Estrad-FE (LOESTRIN 24 FE) 1-20 MG-MCG(24) tablet    Sig: Take 1 tablet by mouth daily.    Dispense:  28 tablet    Refill:  11    Follow-up: Return in about 1 year (around 05/18/2025) for Zoanne Hinders, FNP

## 2024-05-19 ENCOUNTER — Ambulatory Visit: Payer: Self-pay | Admitting: Obstetrics and Gynecology

## 2024-05-19 LAB — COMPREHENSIVE METABOLIC PANEL WITH GFR
ALT: 14 IU/L (ref 0–32)
AST: 19 IU/L (ref 0–40)
Albumin: 4.4 g/dL (ref 4.0–5.0)
Alkaline Phosphatase: 79 IU/L (ref 44–121)
BUN/Creatinine Ratio: 14 (ref 9–23)
BUN: 10 mg/dL (ref 6–20)
Bilirubin Total: 0.4 mg/dL (ref 0.0–1.2)
CO2: 22 mmol/L (ref 20–29)
Calcium: 10.2 mg/dL (ref 8.7–10.2)
Chloride: 102 mmol/L (ref 96–106)
Creatinine, Ser: 0.7 mg/dL (ref 0.57–1.00)
Globulin, Total: 3 g/dL (ref 1.5–4.5)
Glucose: 80 mg/dL (ref 70–99)
Potassium: 4.3 mmol/L (ref 3.5–5.2)
Sodium: 138 mmol/L (ref 134–144)
Total Protein: 7.4 g/dL (ref 6.0–8.5)
eGFR: 119 mL/min/{1.73_m2} (ref >59)

## 2024-05-19 LAB — HEPATITIS C ANTIBODY: Hep C Virus Ab: NONREACTIVE

## 2024-05-19 LAB — RPR: RPR Ser Ql: NONREACTIVE

## 2024-05-19 LAB — LIPID PANEL
Chol/HDL Ratio: 3 ratio (ref 0.0–4.4)
Cholesterol, Total: 184 mg/dL (ref 100–199)
HDL: 61 mg/dL (ref >39)
LDL Chol Calc (NIH): 111 mg/dL — ABNORMAL HIGH (ref 0–99)
Triglycerides: 61 mg/dL (ref 0–149)
VLDL Cholesterol Cal: 12 mg/dL (ref 5–40)

## 2024-05-19 LAB — CBC
Hematocrit: 39.5 % (ref 34.0–46.6)
Hemoglobin: 12.8 g/dL (ref 11.1–15.9)
MCH: 29.3 pg (ref 26.6–33.0)
MCHC: 32.4 g/dL (ref 31.5–35.7)
MCV: 90 fL (ref 79–97)
Platelets: 368 10*3/uL (ref 150–450)
RBC: 4.37 x10E6/uL (ref 3.77–5.28)
RDW: 13.2 % (ref 11.7–15.4)
WBC: 5.7 10*3/uL (ref 3.4–10.8)

## 2024-05-19 LAB — TSH: TSH: 2.45 u[IU]/mL (ref 0.450–4.500)

## 2024-05-19 LAB — HIV ANTIBODY (ROUTINE TESTING W REFLEX): HIV Screen 4th Generation wRfx: NONREACTIVE

## 2024-05-19 LAB — HEPATITIS B SURFACE ANTIGEN: Hepatitis B Surface Ag: NEGATIVE

## 2024-05-20 MED ORDER — METRONIDAZOLE 500 MG PO TABS: 500.0000 mg | ORAL_TABLET | Freq: Two times a day (BID) | ORAL | 0 refills | Status: AC

## 2024-06-09 ENCOUNTER — Telehealth: Admitting: Physician Assistant

## 2024-06-09 DIAGNOSIS — T3695XA Adverse effect of unspecified systemic antibiotic, initial encounter: Secondary | ICD-10-CM

## 2024-06-09 DIAGNOSIS — B379 Candidiasis, unspecified: Secondary | ICD-10-CM | POA: Diagnosis not present

## 2024-06-09 MED ORDER — FLUCONAZOLE 150 MG PO TABS: 150.0000 mg | ORAL_TABLET | ORAL | 0 refills | Status: AC | PRN

## 2024-06-09 NOTE — Progress Notes (Signed)

## 2024-07-30 LAB — AMB RESULTS CONSOLE CBG: Glucose: 122

## 2024-10-11 ENCOUNTER — Other Ambulatory Visit: Payer: Self-pay | Admitting: Obstetrics and Gynecology

## 2024-10-11 MED ORDER — TWIRLA 120-30 MCG/24HR TD PTWK: MEDICATED_PATCH | TRANSDERMAL | 3 refills | Status: DC

## 2024-10-11 NOTE — Progress Notes (Signed)
 Switching from OCP to patch, currently on Loestrin OCP, no missed doses, LMP 10/14. Instructed to switch on day one of cycle.

## 2024-12-23 ENCOUNTER — Other Ambulatory Visit: Payer: Self-pay | Admitting: Obstetrics and Gynecology

## 2024-12-23 MED ORDER — NORETHINDRONE ACET-ETHINYL EST 1-20 MG-MCG PO TABS: 1.0000 | ORAL_TABLET | Freq: Every day | ORAL | 8 refills | Status: AC

## 2024-12-23 NOTE — Progress Notes (Signed)
 Contact dermatitis with contraceptive patch, refill of OCP sent
